# Patient Record
Sex: Male | Born: 1945 | Race: Black or African American | Hispanic: No | Marital: Married | Smoking: Never smoker
Health system: Southern US, Community
[De-identification: ages and names within clinical notes are randomized; demographics above are authoritative.]

## PROBLEM LIST (undated history)

## (undated) DIAGNOSIS — I10 Essential (primary) hypertension: Secondary | ICD-10-CM

## (undated) DIAGNOSIS — Z9889 Other specified postprocedural states: Secondary | ICD-10-CM

## (undated) DIAGNOSIS — Z973 Presence of spectacles and contact lenses: Secondary | ICD-10-CM

## (undated) HISTORY — DX: Other specified postprocedural states: Z98.890

## (undated) HISTORY — DX: Presence of spectacles and contact lenses: Z97.3

## (undated) HISTORY — DX: Essential (primary) hypertension: I10

---

## 1965-04-01 HISTORY — PX: INGUINAL HERNIA REPAIR: SUR1180

## 1991-04-02 HISTORY — PX: INGUINAL HERNIA REPAIR: SUR1180

## 2003-04-09 ENCOUNTER — Emergency Department (HOSPITAL_COMMUNITY): Admission: AD | Admit: 2003-04-09 | Discharge: 2003-04-09 | Payer: Self-pay | Admitting: Family Medicine

## 2014-08-18 LAB — PULMONARY FUNCTION TEST

## 2017-04-01 DIAGNOSIS — Z9289 Personal history of other medical treatment: Secondary | ICD-10-CM

## 2017-04-01 HISTORY — DX: Personal history of other medical treatment: Z92.89

## 2019-02-22 ENCOUNTER — Other Ambulatory Visit: Payer: Self-pay

## 2019-02-22 DIAGNOSIS — Z20822 Contact with and (suspected) exposure to covid-19: Secondary | ICD-10-CM

## 2019-02-23 LAB — NOVEL CORONAVIRUS, NAA: SARS-CoV-2, NAA: NOT DETECTED

## 2019-08-09 ENCOUNTER — Ambulatory Visit (INDEPENDENT_AMBULATORY_CARE_PROVIDER_SITE_OTHER): Payer: Federal, State, Local not specified - PPO | Admitting: Nurse Practitioner

## 2019-08-09 ENCOUNTER — Other Ambulatory Visit: Payer: Self-pay

## 2019-08-09 ENCOUNTER — Encounter: Payer: Self-pay | Admitting: Nurse Practitioner

## 2019-08-09 VITALS — BP 120/78 | HR 92 | Temp 97.3°F | Ht 71.5 in | Wt 287.0 lb

## 2019-08-09 DIAGNOSIS — K219 Gastro-esophageal reflux disease without esophagitis: Secondary | ICD-10-CM

## 2019-08-09 DIAGNOSIS — I10 Essential (primary) hypertension: Secondary | ICD-10-CM

## 2019-08-09 DIAGNOSIS — M5442 Lumbago with sciatica, left side: Secondary | ICD-10-CM

## 2019-08-09 DIAGNOSIS — E782 Mixed hyperlipidemia: Secondary | ICD-10-CM | POA: Diagnosis not present

## 2019-08-09 DIAGNOSIS — J302 Other seasonal allergic rhinitis: Secondary | ICD-10-CM

## 2019-08-09 DIAGNOSIS — G8929 Other chronic pain: Secondary | ICD-10-CM

## 2019-08-09 NOTE — Progress Notes (Signed)
Careteam: Patient Care Team: Sharon Seller, NP as PCP - General (Geriatric Medicine)  PLACE OF SERVICE:  [facility]  Advanced Directive information Does Patient Have a Medical Advance Directive?: No, Would patient like information on creating a medical advance directive?: No - Patient declined  No Active Allergies  Chief Complaint  Patient presents with  . Establish Care    New patient establish care, returning from [place]     HPI: Patient is a 74 y.o. male to establish care.  htn- controlled on amlodipine, doxazosin,  losartan  Hyperglycemia- trying to stay away from diabetes. Has not specifically changed diet but would like to. Using [place] daily   Seasonal allergies- using zyrtec 10 mg daily and flonase into nares which helps symptoms.  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   Hyperlipidemia- not requiring medication.   GERD/hiatal hernia- has a hard time swallowing certain foods. Has to chew foods well. Using omeprazole 20 mg daily   Low back pain- went to orthopedic ppl in the past, has had shots, exercise. He then was told he needed a hip replacement but never went back. Has to left leg sciatica.  Was told he has degenerative disc disease.  Just had labs in March 2021. Review of Systems:  Review of Systems  Constitutional: Negative for chills, fever and weight loss.  HENT: Positive for congestion. Negative for tinnitus.   Respiratory: Negative for cough, sputum production and shortness of breath.   Cardiovascular: Negative for chest pain, palpitations and leg swelling.  Gastrointestinal: Positive for heartburn (controlled by omeprazole. ). Negative for abdominal pain, constipation and diarrhea.  Genitourinary: Positive for frequency (at night, gets up 2 times on avg, has not changed recently). Negative for dysuria and urgency.  Musculoskeletal: Positive for back pain. Negative for falls,  joint pain and myalgias.  Skin: Negative.   Neurological: Negative for dizziness and headaches.  Endo/Heme/Allergies: Positive for environmental allergies.  Psychiatric/Behavioral: Negative for depression and memory loss. The patient does not have insomnia.     Past Medical History:  Diagnosis Date  . History of colonoscopy    [facility]  . History of MRI 04/01/2017  . Hypertension   . Wears glasses    Past Surgical History:  Procedure Laterality Date  . HERNIA REPAIR  04/01/1965   Dr Nelma Rothman. [place] per [facility] new patient packet   . HERNIA REPAIR  04/02/1991   [facility], Per [facility] New Patient Packet    Social History:   reports that he has never smoked. He has never used smokeless tobacco. He reports current alcohol use. He reports that he does not use drugs.  Family History  Problem Relation Age of Onset  . Other Sister   . Diabetes Brother   . Congestive Heart Failure Brother   . Stomach cancer Sister   . Obesity Brother     Medications: Patient's Medications  New Prescriptions   No medications on file  Previous Medications   AMLODIPINE (NORVASC) 10 MG TABLET    Take 10 mg by mouth daily.   CETIRIZINE (ZYRTEC) 10 MG TABLET    Take 10 mg by mouth daily as needed for allergies (Takes daily during allergy season).   DOXAZOSIN (CARDURA) 8 MG TABLET    Take 8 mg by mouth daily.   ELDERBERRY PO    Take 50 mg by mouth daily.    FLUTICASONE (FLONASE) 50  MCG/ACT NASAL SPRAY    Place 2 sprays into both nostrils as needed for allergies or rhinitis.   LOSARTAN (COZAAR) 100 MG TABLET    Take 100 mg by mouth daily.   MILK THISTLE 175 MG TABLET    Take 175 mg by mouth daily.   MULTIPLE VITAMINS-MINERALS (OCUVITE PO)    Take 1 capsule by mouth daily.    NUTRITIONAL SUPPLEMENTS (KIDNEY) CAPS    Take 2 capsules by mouth daily.   OMEGA-3 KRILL OIL 500 MG CAPS    Take 500 mg by mouth daily.   SITAGLIPTIN (JANUVIA) 25 MG TABLET    Take 25 mg by mouth  daily.  Modified Medications   No medications on file  Discontinued Medications   AMLODIPINE (NORVASC) 10 MG TABLET    Take 10 mg by mouth daily.   DOXAZOSIN (CARDURA) 2 MG TABLET    Take 2 mg by mouth daily.   OVER THE COUNTER MEDICATION    300 mg daily. Kidney Cleanser.    Physical Exam:  Vitals:   08/09/19 0911  BP: 120/78  Pulse: 92  Temp: (!) 97.3 F (36.3 C)  TempSrc: Temporal  SpO2: 98%  Weight: 287 lb (130.2 kg)  Height: 5' 11.5" (1.816 m)   Body mass index is 39.47 kg/m. Wt Readings from Last 3 Encounters:  08/09/19 287 lb (130.2 kg)    Physical Exam Constitutional:      General: He is not in acute distress.    Appearance: He is well-developed. He is not diaphoretic.  HENT:     Head: Normocephalic and atraumatic.     Mouth/Throat:     Pharynx: No oropharyngeal exudate.  Eyes:     Conjunctiva/sclera: Conjunctivae normal.     Pupils: Pupils are equal, round, and reactive to light.  Cardiovascular:     Rate and Rhythm: Normal rate and regular rhythm.     Heart sounds: Normal heart sounds.  Pulmonary:     Effort: Pulmonary effort is normal.     Breath sounds: Normal breath sounds.  Abdominal:     General: Bowel sounds are normal.     Palpations: Abdomen is soft.  Musculoskeletal:        General: No swelling or tenderness.     Cervical back: Normal range of motion and neck supple.     Lumbar back: Normal. No tenderness. Negative right straight leg raise test and negative left straight leg raise test.     Right lower leg: No edema.     Left lower leg: No edema.  Skin:    General: Skin is warm and dry.  Neurological:     Mental Status: He is alert and oriented to person, place, and time.    Labs reviewed: Basic Metabolic Panel: No results for input(s): NA, K, CL, CO2, GLUCOSE, BUN, CREATININE, CALCIUM, MG, PHOS, TSH in the last 8760 hours. Liver Function Tests: No results for input(s): AST, ALT, ALKPHOS, BILITOT, PROT, ALBUMIN in the last 8760  hours. No results for input(s): LIPASE, AMYLASE in the last 8760 hours. No results for input(s): AMMONIA in the last 8760 hours. CBC: No results for input(s): WBC, NEUTROABS, HGB, HCT, MCV, PLT in the last 8760 hours. Lipid Panel: No results for input(s): CHOL, HDL, LDLCALC, TRIG, CHOLHDL, LDLDIRECT in the last 8760 hours. TSH: No results for input(s): TSH in the last 8760 hours. A1C: No results found for: HGBA1C   Assessment/Plan 1. Chronic midline low back pain with left-sided sciatica -reports he has  had chronic pain and seen specialist in the past, since he has gained some weight pain has increased. He is also less active and feels like this has contributed.  - Ambulatory referral to Physical Therapy for further evaluation and treatment.   2. Gastroesophageal reflux disease without esophagitis -controlled on omprazole 20 mg daily with dietary modifications.  3. Essential hypertension -stable on amlodipine, doxazosin and losartan. Continue current regimen with lifestyle modifications.  4. Mixed hyperlipidemia -not currently on medication for this, reports he has had recent labs, awaiting records. Continue with dietary modifications.   5. Seasonal allergies Controlled on zyrtec and flonase.   5. Hyperglycemia States he has not been diagnosed with diabetes, but was placed on Januvia 25 mg daily for preventative, will get records to review.   Next appt: AWV first week in July (last was in June) Will await medical records for lab orders Routine follow up in 4 months  Faustine Tates K. Biagio Borg  [facility] [phone]

## 2019-08-09 NOTE — Patient Instructions (Addendum)
Schedule AWV first of July.

## 2019-08-13 ENCOUNTER — Ambulatory Visit: Payer: Self-pay | Admitting: Nurse Practitioner

## 2019-10-19 ENCOUNTER — Telehealth: Payer: Self-pay

## 2019-10-19 NOTE — Telephone Encounter (Signed)
No I have not reviewed his records.

## 2019-10-19 NOTE — Telephone Encounter (Signed)
I left detailed message on voicemail for Darren Fisher informing her that records were not received on her husband.

## 2019-10-19 NOTE — Telephone Encounter (Signed)
Patients wife stopped by the office to verify if Sharon Seller, NP received records from previous provider.  I reviewed basket where incoming records are held for the provider to review and did not locate any records on [employer]. We did receive records on his wife.  Mrs.Benedicto would like for Sharon Seller, NP to confirm that she has not received any records on her husband prior to her calling to ask why records have not been sent to [place].

## 2019-10-28 ENCOUNTER — Telehealth: Payer: Self-pay | Admitting: Nurse Practitioner

## 2019-10-28 NOTE — Telephone Encounter (Signed)
Pt would referral to Dr Christia Reading. ENT at [facility]. Once you generates that referral, I'll send it over.   Thanks,  Sigmund Hazel

## 2019-10-28 NOTE — Telephone Encounter (Signed)
Lm with pt to ck clarity for why ENT is needed & diagnosis for follow up. Wait for call back  Thanks, Misty Stanley

## 2019-10-28 NOTE — Telephone Encounter (Signed)
Spoke with pt & he shared that he has experience sinus & ear congestion most of his adult life that "comes & goes". Darren Fisher felt by seeing an ENT would help him.  I also made a 4 mth follow up appt for 12/29/19, which is what plan stated from visit in May.  Thanks, Misty Stanley

## 2019-10-28 NOTE — Telephone Encounter (Signed)
Need clarification for what he needs referral for. Also he has no follow up and did not schedule his AWV in July.

## 2019-10-29 ENCOUNTER — Encounter: Payer: Self-pay | Admitting: Nurse Practitioner

## 2019-10-29 LAB — HM DIABETES EYE EXAM

## 2019-11-02 ENCOUNTER — Other Ambulatory Visit: Payer: Self-pay

## 2019-11-02 DIAGNOSIS — Z20822 Contact with and (suspected) exposure to covid-19: Secondary | ICD-10-CM

## 2019-11-03 LAB — NOVEL CORONAVIRUS, NAA: SARS-CoV-2, NAA: NOT DETECTED

## 2019-11-03 LAB — SARS-COV-2, NAA 2 DAY TAT

## 2019-11-25 DIAGNOSIS — J343 Hypertrophy of nasal turbinates: Secondary | ICD-10-CM | POA: Insufficient documentation

## 2019-11-25 DIAGNOSIS — T7840XA Allergy, unspecified, initial encounter: Secondary | ICD-10-CM | POA: Insufficient documentation

## 2019-12-08 LAB — HM DIABETES EYE EXAM

## 2019-12-29 ENCOUNTER — Other Ambulatory Visit: Payer: Self-pay

## 2019-12-29 ENCOUNTER — Ambulatory Visit (INDEPENDENT_AMBULATORY_CARE_PROVIDER_SITE_OTHER): Payer: Federal, State, Local not specified - PPO | Admitting: Nurse Practitioner

## 2019-12-29 ENCOUNTER — Encounter: Payer: Self-pay | Admitting: Nurse Practitioner

## 2019-12-29 VITALS — BP 140/90 | HR 86 | Temp 97.7°F | Ht 71.5 in | Wt 289.4 lb

## 2019-12-29 DIAGNOSIS — Z1159 Encounter for screening for other viral diseases: Secondary | ICD-10-CM

## 2019-12-29 DIAGNOSIS — Z23 Encounter for immunization: Secondary | ICD-10-CM | POA: Diagnosis not present

## 2019-12-29 DIAGNOSIS — R0683 Snoring: Secondary | ICD-10-CM

## 2019-12-29 DIAGNOSIS — E1140 Type 2 diabetes mellitus with diabetic neuropathy, unspecified: Secondary | ICD-10-CM | POA: Insufficient documentation

## 2019-12-29 DIAGNOSIS — E782 Mixed hyperlipidemia: Secondary | ICD-10-CM | POA: Diagnosis not present

## 2019-12-29 DIAGNOSIS — J302 Other seasonal allergic rhinitis: Secondary | ICD-10-CM | POA: Insufficient documentation

## 2019-12-29 DIAGNOSIS — I10 Essential (primary) hypertension: Secondary | ICD-10-CM | POA: Diagnosis not present

## 2019-12-29 NOTE — Progress Notes (Signed)
Careteam: Patient Care Team: Sharon Seller, NP as PCP - General (Geriatric Medicine)  PLACE OF SERVICE:  [facility]  Advanced Directive information Does Patient Have a Medical Advance Directive?: Yes, Type of Advance Directive: Healthcare Power of Attorney, Does patient want to make changes to medical advance directive?: No - Patient declined  No Known Allergies  Chief Complaint  Patient presents with  . Medical Management of Chronic Issues    4 month follow up. Patient states that he is doing fine.Patient would like to get Flu vaccine today.  Marland Kitchen Best Practice Recommendations    Hep C screening, Pneumonia, Flu vaccine,Tetanus/Tdap vaccine.     HPI: Patient is a 74 y.o. male for routine follow up   Has not seen the allergies yet but has followed up with ENT. ENT felt like allergies would be the most beneficial. Reports ENT placed referral to allergist. Taking flonase for allergies at this time. Does not feel like this has been beneficial. Has good days and bad days, mostly bad. Worse since he has moved. Has not tired claritin or zyrtec. ENT recommended to use flonase.   DM-  No hypoglycemia, does not check blood sugar. Reports neuropathy to LE.  Has cataract, scheduled for surgery but insurance is not acting right  htn- does not follow dietary modifications, continues on norvasc, losartan, and cardura  Takes blood pressure at home but unsure of readings.   GERD- continues on omeprazole.   Review of Systems:  Review of Systems  Constitutional: Negative for chills, fever and weight loss.  HENT: Negative for tinnitus.   Respiratory: Negative for cough, sputum production and shortness of breath.   Cardiovascular: Negative for chest pain, palpitations and leg swelling.  Gastrointestinal: Negative for abdominal pain, constipation, diarrhea and heartburn.  Genitourinary: Negative for dysuria, frequency and urgency.  Musculoskeletal: Negative for back pain, falls, joint pain  and myalgias.  Skin: Negative.   Neurological: Negative for dizziness and headaches.  Endo/Heme/Allergies: Positive for environmental allergies.  Psychiatric/Behavioral: Negative for depression and memory loss. The patient does not have insomnia.    Past Medical History:  Diagnosis Date  . History of colonoscopy    [facility]  . History of MRI 04/01/2017  . Hypertension   . Wears glasses    Past Surgical History:  Procedure Laterality Date  . INGUINAL HERNIA REPAIR Right 04/01/1965   Dr Nelma Rothman. [place] per [facility] new patient packet   . INGUINAL HERNIA REPAIR Left 04/02/1991   [facility], Per [facility] New Patient Packet    Social History:   reports that he has never smoked. He has never used smokeless tobacco. He reports current alcohol use. He reports that he does not use drugs.  Family History  Problem Relation Age of Onset  . Asthma Father   . Other Sister   . Diabetes Brother   . Congestive Heart Failure Brother   . Stomach cancer Sister   . Obesity Brother     Medications: Patient's Medications  New Prescriptions   No medications on file  Previous Medications   AMLODIPINE (NORVASC) 10 MG TABLET    Take 10 mg by mouth daily.   DOXAZOSIN (CARDURA) 8 MG TABLET    Take 8 mg by mouth daily.   ELDERBERRY PO    Take 50 mg by mouth daily.    FLUTICASONE (FLONASE) 50 MCG/ACT NASAL SPRAY    Place 2 sprays into both nostrils as needed for allergies or rhinitis.  LOSARTAN (COZAAR) 100 MG TABLET    Take 100 mg by mouth daily.   MILK THISTLE 175 MG TABLET    Take 175 mg by mouth daily.   MULTIPLE VITAMINS-MINERALS (OCUVITE PO)    Take 1 capsule by mouth daily.    NUTRITIONAL SUPPLEMENTS (KIDNEY) CAPS    Take 2 capsules by mouth daily.   OMEGA-3 KRILL OIL 500 MG CAPS    Take 500 mg by mouth daily.   OMEPRAZOLE (PRILOSEC) 20 MG CAPSULE    Take 20 mg by mouth daily.   SITAGLIPTIN (JANUVIA) 25 MG TABLET    Take 25 mg by mouth daily.  Modified  Medications   No medications on file  Discontinued Medications   CETIRIZINE (ZYRTEC) 10 MG TABLET    Take 10 mg by mouth daily as needed for allergies (Takes daily during allergy season).   CETIRIZINE (ZYRTEC) 10 MG TABLET    Take by mouth.   FLUTICASONE (FLONASE) 50 MCG/ACT NASAL SPRAY    Place into the nose.   KETOROLAC (ACULAR) 0.5 % OPHTHALMIC SOLUTION    Place 1 drop into the left eye 4 (four) times daily.   OFLOXACIN (OCUFLOX) 0.3 % OPHTHALMIC SOLUTION    1 drop 4 (four) times daily.   PREDNISOLONE ACETATE (PRED FORTE) 1 % OPHTHALMIC SUSPENSION    Place 1 drop into the left eye 4 (four) times daily.    Physical Exam:  Vitals:   12/29/19 1327  BP: 140/90  Pulse: 86  Temp: 97.7 F (36.5 C)  TempSrc: Temporal  SpO2: 95%  Weight: 289 lb 6.4 oz (131.3 kg)  Height: 5' 11.5" (1.816 m)   Body mass index is 39.8 kg/m. Wt Readings from Last 3 Encounters:  12/29/19 289 lb 6.4 oz (131.3 kg)  08/09/19 287 lb (130.2 kg)    Physical Exam Constitutional:      General: He is not in acute distress.    Appearance: He is well-developed. He is not diaphoretic.  HENT:     Head: Normocephalic and atraumatic.     Mouth/Throat:     Pharynx: No oropharyngeal exudate.  Eyes:     Conjunctiva/sclera: Conjunctivae normal.     Pupils: Pupils are equal, round, and reactive to light.  Cardiovascular:     Rate and Rhythm: Normal rate and regular rhythm.     Heart sounds: Normal heart sounds.  Pulmonary:     Effort: Pulmonary effort is normal.     Breath sounds: Normal breath sounds.  Abdominal:     General: Bowel sounds are normal.     Palpations: Abdomen is soft.  Musculoskeletal:        General: No tenderness.     Cervical back: Normal range of motion and neck supple.  Skin:    General: Skin is warm and dry.  Neurological:     Mental Status: He is alert and oriented to person, place, and time.     Labs reviewed: Basic Metabolic Panel: No results for input(s): NA, K, CL, CO2,  GLUCOSE, BUN, CREATININE, CALCIUM, MG, PHOS, TSH in the last 8760 hours. Liver Function Tests: No results for input(s): AST, ALT, ALKPHOS, BILITOT, PROT, ALBUMIN in the last 8760 hours. No results for input(s): LIPASE, AMYLASE in the last 8760 hours. No results for input(s): AMMONIA in the last 8760 hours. CBC: No results for input(s): WBC, NEUTROABS, HGB, HCT, MCV, PLT in the last 8760 hours. Lipid Panel: No results for input(s): CHOL, HDL, LDLCALC, TRIG, CHOLHDL, LDLDIRECT in the last  8760 hours. TSH: No results for input(s): TSH in the last 8760 hours. A1C: No results found for: HGBA1C   Assessment/Plan 1. Need for influenza vaccination - Flu Vaccine QUAD High Dose(Fluad)  2. Essential hypertension Goal is <140/90, repeat blood pressure was unchanged. He is currently on amlodipine 10 mg daily, doxazosin 8 mg daily with losartan 100 mg daily.  Discussed dietary modifications to bring him to goal. Handouts given on salty six and DASH diet, encouraged dietary modifications. To monitor bp at home and notify if persistently staying over 140/90. Will continue current medication.  - COMPLETE METABOLIC PANEL WITH GFR - CBC with Differential/Platelet  3. Mixed hyperlipidemia -not currently on medication, LDL goal <70, will get labs today to evaluate  - Lipid Panel - COMPLETE METABOLIC PANEL WITH GFR  4. Seasonal allergies Worsening symptoms, has been referred to allergies, using Flonase, encouraged to add claritin or zyrtec 10 mg daily and to continue flonase at this time.   5. Type 2 diabetes mellitus with diabetic neuropathy, without long-term current use of insulin (HCC) -Encouraged dietary compliance, routine foot care/monitoring and to keep up with diabetic eye exams through ophthalmology  -continues on januvia - Hemoglobin A1c  6. Morbid obesity (HCC) -weight loss needed, he admits to needing help with the weight loss and interested in medical weight loss management referral.    - Amb Ref to Medical Weight Management - Ambulatory referral to Pulmonology  7. Snores - Ambulatory referral to Pulmonology for evaluation of OSA  8. Need for hepatitis C screening test - Hepatitis C antibody   Next appt: 6 months, sooner If needed Belynda Pagaduan K. Biagio Borg  [facility] Adult Medicine [phone]

## 2019-12-29 NOTE — Patient Instructions (Signed)
DASH Eating Plan DASH stands for "Dietary Approaches to Stop Hypertension." The DASH eating plan is a healthy eating plan that has been shown to reduce high blood pressure (hypertension). It may also reduce your risk for type 2 diabetes, heart disease, and stroke. The DASH eating plan may also help with weight loss. What are tips for following this plan?  General guidelines  Avoid eating more than 2,300 mg (milligrams) of salt (sodium) a day. If you have hypertension, you may need to reduce your sodium intake to 1,500 mg a day.  Limit alcohol intake to no more than 1 drink a day for nonpregnant women and 2 drinks a day for men. One drink equals 12 oz of beer, 5 oz of wine, or 1 oz of hard liquor.  Work with your health care provider to maintain a healthy body weight or to lose weight. Ask what an ideal weight is for you.  Get at least 30 minutes of exercise that causes your heart to beat faster (aerobic exercise) most days of the week. Activities may include walking, swimming, or biking.  Work with your health care provider or diet and nutrition specialist (dietitian) to adjust your eating plan to your individual calorie needs. Reading food labels   Check food labels for the amount of sodium per serving. Choose foods with less than 5 percent of the Daily Value of sodium. Generally, foods with less than 300 mg of sodium per serving fit into this eating plan.  To find whole grains, look for the word "whole" as the first word in the ingredient list. Shopping  Buy products labeled as "low-sodium" or "no salt added."  Buy fresh foods. Avoid canned foods and premade or frozen meals. Cooking  Avoid adding salt when cooking. Use salt-free seasonings or herbs instead of table salt or sea salt. Check with your health care provider or pharmacist before using salt substitutes.  Do not fry foods. Cook foods using healthy methods such as baking, boiling, grilling, and broiling instead.  Cook with  heart-healthy oils, such as olive, canola, soybean, or sunflower oil. Meal planning  Eat a balanced diet that includes: ? 5 or more servings of fruits and vegetables each day. At each meal, try to fill half of your plate with fruits and vegetables. ? Up to 6-8 servings of whole grains each day. ? Less than 6 oz of lean meat, poultry, or fish each day. A 3-oz serving of meat is about the same size as a deck of cards. One egg equals 1 oz. ? 2 servings of low-fat dairy each day. ? A serving of nuts, seeds, or beans 5 times each week. ? Heart-healthy fats. Healthy fats called Omega-3 fatty acids are found in foods such as flaxseeds and coldwater fish, like sardines, salmon, and mackerel.  Limit how much you eat of the following: ? Canned or prepackaged foods. ? Food that is high in trans fat, such as fried foods. ? Food that is high in saturated fat, such as fatty meat. ? Sweets, desserts, sugary drinks, and other foods with added sugar. ? Full-fat dairy products.  Do not salt foods before eating.  Try to eat at least 2 vegetarian meals each week.  Eat more home-cooked food and less restaurant, buffet, and fast food.  When eating at a restaurant, ask that your food be prepared with less salt or no salt, if possible. What foods are recommended? The items listed may not be a complete list. Talk with your dietitian about   what dietary choices are best for you. Grains Whole-grain or whole-wheat bread. Whole-grain or whole-wheat pasta. Brown rice. Oatmeal. Quinoa. Bulgur. Whole-grain and low-sodium cereals. Pita bread. Low-fat, low-sodium crackers. Whole-wheat flour tortillas. Vegetables Fresh or frozen vegetables (raw, steamed, roasted, or grilled). Low-sodium or reduced-sodium tomato and vegetable juice. Low-sodium or reduced-sodium tomato sauce and tomato paste. Low-sodium or reduced-sodium canned vegetables. Fruits All fresh, dried, or frozen fruit. Canned fruit in natural juice (without  added sugar). Meat and other protein foods Skinless chicken or turkey. Ground chicken or turkey. Pork with fat trimmed off. Fish and seafood. Egg whites. Dried beans, peas, or lentils. Unsalted nuts, nut butters, and seeds. Unsalted canned beans. Lean cuts of beef with fat trimmed off. Low-sodium, lean deli meat. Dairy Low-fat (1%) or fat-free (skim) milk. Fat-free, low-fat, or reduced-fat cheeses. Nonfat, low-sodium ricotta or cottage cheese. Low-fat or nonfat yogurt. Low-fat, low-sodium cheese. Fats and oils Soft margarine without trans fats. Vegetable oil. Low-fat, reduced-fat, or light mayonnaise and salad dressings (reduced-sodium). Canola, safflower, olive, soybean, and sunflower oils. Avocado. Seasoning and other foods Herbs. Spices. Seasoning mixes without salt. Unsalted popcorn and pretzels. Fat-free sweets. What foods are not recommended? The items listed may not be a complete list. Talk with your dietitian about what dietary choices are best for you. Grains Baked goods made with fat, such as croissants, muffins, or some breads. Dry pasta or rice meal packs. Vegetables Creamed or fried vegetables. Vegetables in a cheese sauce. Regular canned vegetables (not low-sodium or reduced-sodium). Regular canned tomato sauce and paste (not low-sodium or reduced-sodium). Regular tomato and vegetable juice (not low-sodium or reduced-sodium). Pickles. Olives. Fruits Canned fruit in a light or heavy syrup. Fried fruit. Fruit in cream or butter sauce. Meat and other protein foods Fatty cuts of meat. Ribs. Fried meat. Bacon. Sausage. Bologna and other processed lunch meats. Salami. Fatback. Hotdogs. Bratwurst. Salted nuts and seeds. Canned beans with added salt. Canned or smoked fish. Whole eggs or egg yolks. Chicken or turkey with skin. Dairy Whole or 2% milk, cream, and half-and-half. Whole or full-fat cream cheese. Whole-fat or sweetened yogurt. Full-fat cheese. Nondairy creamers. Whipped toppings.  Processed cheese and cheese spreads. Fats and oils Butter. Stick margarine. Lard. Shortening. Ghee. Bacon fat. Tropical oils, such as coconut, palm kernel, or palm oil. Seasoning and other foods Salted popcorn and pretzels. Onion salt, garlic salt, seasoned salt, table salt, and sea salt. Worcestershire sauce. Tartar sauce. Barbecue sauce. Teriyaki sauce. Soy sauce, including reduced-sodium. Steak sauce. Canned and packaged gravies. Fish sauce. Oyster sauce. Cocktail sauce. Horseradish that you find on the shelf. Ketchup. Mustard. Meat flavorings and tenderizers. Bouillon cubes. Hot sauce and Tabasco sauce. Premade or packaged marinades. Premade or packaged taco seasonings. Relishes. Regular salad dressings. Where to find more information:  National Heart, Lung, and Blood Institute: www.nhlbi.nih.gov  American Heart Association: www.heart.org Summary  The DASH eating plan is a healthy eating plan that has been shown to reduce high blood pressure (hypertension). It may also reduce your risk for type 2 diabetes, heart disease, and stroke.  With the DASH eating plan, you should limit salt (sodium) intake to 2,300 mg a day. If you have hypertension, you may need to reduce your sodium intake to 1,500 mg a day.  When on the DASH eating plan, aim to eat more fresh fruits and vegetables, whole grains, lean proteins, low-fat dairy, and heart-healthy fats.  Work with your health care provider or diet and nutrition specialist (dietitian) to adjust your eating plan to your   individual calorie needs. This information is not intended to replace advice given to you by your health care provider. Make sure you discuss any questions you have with your health care provider. Document Revised: 02/28/2017 Document Reviewed: 03/11/2016 Elsevier Patient Education  2020 Elsevier Inc.  

## 2019-12-30 ENCOUNTER — Telehealth: Payer: Self-pay

## 2019-12-30 DIAGNOSIS — E782 Mixed hyperlipidemia: Secondary | ICD-10-CM

## 2019-12-30 MED ORDER — ROSUVASTATIN CALCIUM 5 MG PO TABS: ORAL_TABLET | ORAL | 1 refills | Status: DC

## 2019-12-30 MED ORDER — ROSUVASTATIN CALCIUM 10 MG PO TABS: ORAL_TABLET | ORAL | 1 refills | Status: DC

## 2019-12-30 NOTE — Telephone Encounter (Signed)
-----   Message from Sharon Seller, NP sent at 12/30/2019  1:32 PM EDT ----- LDL (bad cholesterol) is high, should be less than 70 but at 142, would recommend starting crestor 10 mg daily at this time with dietary modifications to bring to goal.  To start crestor at 5 mg  (1/2 tablet) daily for 2 weeks then increase to 1 tablet daily there after- to take in the evening/bedtime for best results. Follow up CMP and lipid panel (fasting) in 6 weeks   A1c at 7.0, ideally would like to be less than 7, working on diet modifications and increase in physical activity can help bring him to goal.  Slightly anemic (low hgb on lab), can we add TIBC, iron and ferritin level.  -impaired kidney function noted. Encourage proper hydration and to avoid NSAIDS (Aleve, Advil, Motrin, Ibuprofen)

## 2019-12-30 NOTE — Telephone Encounter (Signed)
Patients wife was in office to get labs done. I discussed labs with Mrs.Homesly and she verbalized understanding.  1. RX for crestor sent to pharmacy 2.) Add on request given to Coralie Keens lab tech 3.) Appt scheduled for 02/16/2020 to recheck CMP and Lipid 4.) Future orders placed

## 2020-01-01 LAB — CBC WITH DIFFERENTIAL/PLATELET
Absolute Monocytes: 575 cells/uL (ref 200–950)
Basophils Absolute: 21 cells/uL (ref 0–200)
Basophils Relative: 0.3 %
Eosinophils Absolute: 263 cells/uL (ref 15–500)
Eosinophils Relative: 3.7 %
HCT: 40.7 % (ref 38.5–50.0)
Hemoglobin: 13 g/dL — ABNORMAL LOW (ref 13.2–17.1)
Lymphs Abs: 2215 cells/uL (ref 850–3900)
MCH: 25.8 pg — ABNORMAL LOW (ref 27.0–33.0)
MCHC: 31.9 g/dL — ABNORMAL LOW (ref 32.0–36.0)
MCV: 80.8 fL (ref 80.0–100.0)
MPV: 9.6 fL (ref 7.5–12.5)
Monocytes Relative: 8.1 %
Neutro Abs: 4026 cells/uL (ref 1500–7800)
Neutrophils Relative %: 56.7 %
Platelets: 290 10*3/uL (ref 140–400)
RBC: 5.04 10*6/uL (ref 4.20–5.80)
RDW: 14.5 % (ref 11.0–15.0)
Total Lymphocyte: 31.2 %
WBC: 7.1 10*3/uL (ref 3.8–10.8)

## 2020-01-01 LAB — IRON,TIBC AND FERRITIN PANEL
%SAT: 25 % (calc) (ref 20–48)
Ferritin: 115 ng/mL (ref 24–380)
Iron: 95 ug/dL (ref 50–180)
TIBC: 383 mcg/dL (calc) (ref 250–425)

## 2020-01-01 LAB — TEST AUTHORIZATION

## 2020-01-01 LAB — COMPLETE METABOLIC PANEL WITH GFR
AG Ratio: 1.7 (calc) (ref 1.0–2.5)
ALT: 14 U/L (ref 9–46)
AST: 16 U/L (ref 10–35)
Albumin: 4.8 g/dL (ref 3.6–5.1)
Alkaline phosphatase (APISO): 85 U/L (ref 35–144)
BUN/Creatinine Ratio: 12 (calc) (ref 6–22)
BUN: 16 mg/dL (ref 7–25)
CO2: 25 mmol/L (ref 20–32)
Calcium: 9.7 mg/dL (ref 8.6–10.3)
Chloride: 102 mmol/L (ref 98–110)
Creat: 1.37 mg/dL — ABNORMAL HIGH (ref 0.70–1.18)
GFR, Est African American: 58 mL/min/{1.73_m2} — ABNORMAL LOW (ref >=60)
GFR, Est Non African American: 50 mL/min/{1.73_m2} — ABNORMAL LOW (ref >=60)
Globulin: 2.9 g/dL (calc) (ref 1.9–3.7)
Glucose, Bld: 108 mg/dL — ABNORMAL HIGH (ref 65–99)
Potassium: 4.5 mmol/L (ref 3.5–5.3)
Sodium: 137 mmol/L (ref 135–146)
Total Bilirubin: 0.5 mg/dL (ref 0.2–1.2)
Total Protein: 7.7 g/dL (ref 6.1–8.1)

## 2020-01-01 LAB — HEMOGLOBIN A1C
Hgb A1c MFr Bld: 7 % of total Hgb — ABNORMAL HIGH (ref <5.7)
Mean Plasma Glucose: 154 (calc)
eAG (mmol/L): 8.5 (calc)

## 2020-01-01 LAB — LIPID PANEL
Cholesterol: 205 mg/dL — ABNORMAL HIGH (ref <200)
HDL: 35 mg/dL — ABNORMAL LOW (ref >=40)
LDL Cholesterol (Calc): 142 mg/dL (calc) — ABNORMAL HIGH
Non-HDL Cholesterol (Calc): 170 mg/dL (calc) — ABNORMAL HIGH (ref <130)
Total CHOL/HDL Ratio: 5.9 (calc) — ABNORMAL HIGH (ref <5.0)
Triglycerides: 150 mg/dL — ABNORMAL HIGH (ref <150)

## 2020-01-01 LAB — HEPATITIS C ANTIBODY
Hepatitis C Ab: NONREACTIVE
SIGNAL TO CUT-OFF: 0.02 (ref <1.00)

## 2020-02-16 ENCOUNTER — Other Ambulatory Visit: Payer: Federal, State, Local not specified - PPO

## 2020-02-16 ENCOUNTER — Other Ambulatory Visit: Payer: Self-pay

## 2020-02-16 DIAGNOSIS — E782 Mixed hyperlipidemia: Secondary | ICD-10-CM

## 2020-02-17 LAB — COMPLETE METABOLIC PANEL WITH GFR
AG Ratio: 1.5 (calc) (ref 1.0–2.5)
ALT: 17 U/L (ref 9–46)
AST: 16 U/L (ref 10–35)
Albumin: 4.4 g/dL (ref 3.6–5.1)
Alkaline phosphatase (APISO): 80 U/L (ref 35–144)
BUN/Creatinine Ratio: 12 (calc) (ref 6–22)
BUN: 15 mg/dL (ref 7–25)
CO2: 30 mmol/L (ref 20–32)
Calcium: 9.3 mg/dL (ref 8.6–10.3)
Chloride: 103 mmol/L (ref 98–110)
Creat: 1.3 mg/dL — ABNORMAL HIGH (ref 0.70–1.18)
GFR, Est African American: 62 mL/min/{1.73_m2} (ref >=60)
GFR, Est Non African American: 53 mL/min/{1.73_m2} — ABNORMAL LOW (ref >=60)
Globulin: 3 g/dL (calc) (ref 1.9–3.7)
Glucose, Bld: 144 mg/dL — ABNORMAL HIGH (ref 65–99)
Potassium: 4.6 mmol/L (ref 3.5–5.3)
Sodium: 140 mmol/L (ref 135–146)
Total Bilirubin: 0.3 mg/dL (ref 0.2–1.2)
Total Protein: 7.4 g/dL (ref 6.1–8.1)

## 2020-02-17 LAB — LIPID PANEL
Cholesterol: 129 mg/dL (ref <200)
HDL: 34 mg/dL — ABNORMAL LOW (ref >=40)
LDL Cholesterol (Calc): 75 mg/dL (calc)
Non-HDL Cholesterol (Calc): 95 mg/dL (calc) (ref <130)
Total CHOL/HDL Ratio: 3.8 (calc) (ref <5.0)
Triglycerides: 123 mg/dL (ref <150)

## 2020-05-02 ENCOUNTER — Other Ambulatory Visit: Payer: Self-pay | Admitting: *Deleted

## 2020-05-02 MED ORDER — AMLODIPINE BESYLATE 10 MG PO TABS
10.0000 mg | ORAL_TABLET | Freq: Every day | ORAL | 1 refills | Status: DC
Start: 2020-05-02 — End: 2021-02-05

## 2020-05-02 MED ORDER — SITAGLIPTIN PHOSPHATE 25 MG PO TABS: 25.0000 mg | ORAL_TABLET | Freq: Every day | ORAL | 1 refills | Status: DC

## 2020-05-02 MED ORDER — LOSARTAN POTASSIUM 100 MG PO TABS
100.0000 mg | ORAL_TABLET | Freq: Every day | ORAL | 1 refills | Status: DC
Start: 2020-05-02 — End: 2020-10-25

## 2020-05-02 NOTE — Telephone Encounter (Signed)
Patient wife requested refills Patient has upcoming appointment.

## 2020-06-28 ENCOUNTER — Other Ambulatory Visit: Payer: Self-pay

## 2020-06-28 ENCOUNTER — Other Ambulatory Visit: Payer: Self-pay | Admitting: Nurse Practitioner

## 2020-06-28 ENCOUNTER — Ambulatory Visit (INDEPENDENT_AMBULATORY_CARE_PROVIDER_SITE_OTHER): Payer: Federal, State, Local not specified - PPO | Admitting: Nurse Practitioner

## 2020-06-28 ENCOUNTER — Encounter: Payer: Self-pay | Admitting: Nurse Practitioner

## 2020-06-28 VITALS — BP 122/78 | HR 104 | Temp 96.8°F | Ht 72.0 in | Wt 300.0 lb

## 2020-06-28 DIAGNOSIS — E782 Mixed hyperlipidemia: Secondary | ICD-10-CM

## 2020-06-28 DIAGNOSIS — M5442 Lumbago with sciatica, left side: Secondary | ICD-10-CM

## 2020-06-28 DIAGNOSIS — K219 Gastro-esophageal reflux disease without esophagitis: Secondary | ICD-10-CM

## 2020-06-28 DIAGNOSIS — E1140 Type 2 diabetes mellitus with diabetic neuropathy, unspecified: Secondary | ICD-10-CM

## 2020-06-28 DIAGNOSIS — N401 Enlarged prostate with lower urinary tract symptoms: Secondary | ICD-10-CM

## 2020-06-28 DIAGNOSIS — J302 Other seasonal allergic rhinitis: Secondary | ICD-10-CM

## 2020-06-28 DIAGNOSIS — I10 Essential (primary) hypertension: Secondary | ICD-10-CM

## 2020-06-28 DIAGNOSIS — G8929 Other chronic pain: Secondary | ICD-10-CM

## 2020-06-28 DIAGNOSIS — R351 Nocturia: Secondary | ICD-10-CM

## 2020-06-28 DIAGNOSIS — R350 Frequency of micturition: Secondary | ICD-10-CM

## 2020-06-28 MED ORDER — ROSUVASTATIN CALCIUM 10 MG PO TABS: 10.0000 mg | ORAL_TABLET | Freq: Every day | ORAL | 1 refills | Status: DC

## 2020-06-28 NOTE — Patient Instructions (Addendum)
Tylenol 500 mg 2 tablets every 8 hours PRN pain.   Weight loss will help your pains.   To use nasal wash daily Can use saline as needed throughout the day if needed

## 2020-06-28 NOTE — Progress Notes (Signed)
Careteam: Darren Fisher Care Team: Sharon Seller, NP as PCP - General (Geriatric Medicine)  PLACE OF SERVICE:  [facility]  Advanced Directive information    No Known Allergies  Chief Complaint  Darren Fisher presents with  . Medical Management of Chronic Issues    6 month follow-up and discuss need for TD/tdap, PNA, and A1c. Foot exam today. Examine big toe on right foot. Sinus congestions, planning to see Dr.bates. Discuss urinary frequency at night. Ongoing back and hip pain.       HPI: Darren Fisher is a 75 y.o. male here for follow up.  Allergies- Went to see Dr. Jenne Pane who suggested an allergist. Awaiting to hear back from insurance before booking an appointment. Takes OTC medication to help with symptom relief.   Joint pain- was told by a specialist in the past to have DDD in lower back, feels a constant pain when trying to get up and get moving. Does not take any medication to help. Sometimes has sciatica pain down left leg but it resolves quickly. Has gained 11 pounds since last visit. Feels like he uses his back as an excuse not to exercise. Wants to start eating better and working out.   Urinary frequency- reports increased frequency at night, going to the bathroom 2-3 times. Denies dysuria or urgency.  DM- no episodes of hypoglycemia, but does not monitor blood sugars at home.   HTN- continues on norvasc, losartan. Reports he is not taking his doxazosin. Checks BP at home and reports they are normal at home but cannot remember exact numbers.   GERD- no episodes of heartburn, continues on omeprazole  Awaiting cataract surgery once insurance is corrected   Review of Systems:  Review of Systems  Constitutional: Negative for chills, fever and weight loss.  HENT: Negative for tinnitus.   Respiratory: Negative for cough, sputum production and shortness of breath.   Cardiovascular: Negative for chest pain, palpitations and leg swelling.  Gastrointestinal: Negative for abdominal  pain, constipation, nausea and vomiting.  Genitourinary: Positive for frequency. Negative for dysuria and urgency.  Musculoskeletal: Positive for back pain and joint pain. Negative for falls and myalgias.  Skin: Negative.   Neurological: Negative for dizziness, weakness and headaches.  Psychiatric/Behavioral: Negative for depression and memory loss. The Darren Fisher does not have insomnia.     Past Medical History:  Diagnosis Date  . History of colonoscopy    [facility]  . History of MRI 04/01/2017  . Hypertension   . Wears glasses    Past Surgical History:  Procedure Laterality Date  . INGUINAL HERNIA REPAIR Right 04/01/1965   Dr Nelma Rothman. [facility] per [facility] new Darren Fisher packet   . INGUINAL HERNIA REPAIR Left 04/02/1991   [facility], Per [facility] New Darren Fisher Packet    Social History:   reports that he has never smoked. He has never used smokeless tobacco. He reports current alcohol use. He reports that he does not use drugs.  Family History  Problem Relation Age of Onset  . Asthma Father   . Other Sister   . Diabetes Brother   . Congestive Heart Failure Brother   . Stomach cancer Sister   . Obesity Brother     Medications: Darren Fisher's Medications  New Prescriptions   No medications on file  Previous Medications   AMLODIPINE (NORVASC) 10 MG TABLET    Take 1 tablet (10 mg total) by mouth daily.   DOXAZOSIN (CARDURA) 8 MG TABLET    Take 8 mg  by mouth daily.   ELDERBERRY PO    Take 50 mg by mouth daily.    LOSARTAN (COZAAR) 100 MG TABLET    Take 1 tablet (100 mg total) by mouth daily.   MILK THISTLE 175 MG TABLET    Take 175 mg by mouth daily.   MULTIPLE VITAMINS-MINERALS (OCUVITE PO)    Take 1 capsule by mouth daily.    NUTRITIONAL SUPPLEMENTS (KIDNEY) CAPS    Take 2 capsules by mouth daily.   OMEGA-3 KRILL OIL 500 MG CAPS    Take 500 mg by mouth daily.   OMEPRAZOLE (PRILOSEC) 20 MG CAPSULE    Take 20 mg by mouth daily.   ROSUVASTATIN (CRESTOR) 10 MG  TABLET    TAKE 1/2 TABLET BY MOUTH EVERY DAY FOR 2 WEEKS. THEN INCREASE TO 1 TABLET BY MOUTH EVERY DAY   SITAGLIPTIN (JANUVIA) 25 MG TABLET    Take 1 tablet (25 mg total) by mouth daily.  Modified Medications   No medications on file  Discontinued Medications   FLUTICASONE (FLONASE) 50 MCG/ACT NASAL SPRAY    Place 2 sprays into both nostrils as needed for allergies or rhinitis.    Physical Exam:  Vitals:   06/28/20 1312  Weight: 300 lb (136.1 kg)  Height: 6' (1.829 m)   Body mass index is 40.69 kg/m. Wt Readings from Last 3 Encounters:  06/28/20 300 lb (136.1 kg)  12/29/19 289 lb 6.4 oz (131.3 kg)  08/09/19 287 lb (130.2 kg)    Physical Exam Constitutional:      General: He is not in acute distress.    Appearance: He is well-developed. He is not diaphoretic.  HENT:     Head: Normocephalic and atraumatic.     Nose: Nose normal.  Eyes:     Pupils: Pupils are equal, round, and reactive to light.  Cardiovascular:     Rate and Rhythm: Normal rate and regular rhythm.     Pulses:          Posterior tibial pulses are 2+ on the right side and 2+ on the left side.     Heart sounds: Normal heart sounds.  Pulmonary:     Effort: Pulmonary effort is normal.     Breath sounds: Normal breath sounds.  Abdominal:     General: Bowel sounds are normal.     Palpations: Abdomen is soft.  Musculoskeletal:        General: No tenderness.     Cervical back: Normal range of motion and neck supple.     Lumbar back: Normal.  Feet:     Right foot:     Protective Sensation: 10 sites tested. 10 sites sensed.     Skin integrity: Skin integrity normal.     Toenail Condition: Fungal disease present.    Left foot:     Protective Sensation: 10 sites tested. 10 sites sensed.     Skin integrity: Skin integrity normal.     Toenail Condition: Fungal disease present. Skin:    General: Skin is warm and dry.  Neurological:     Mental Status: He is alert and oriented to person, place, and time.   Psychiatric:        Mood and Affect: Mood normal.        Behavior: Behavior normal.    Labs reviewed: Basic Metabolic Panel: Recent Labs    12/29/19 1403 02/16/20 1012  NA 137 140  K 4.5 4.6  CL 102 103  CO2 25 30  GLUCOSE 108* 144*  BUN 16 15  CREATININE 1.37* 1.30*  CALCIUM 9.7 9.3   Liver Function Tests: Recent Labs    12/29/19 1403 02/16/20 1012  AST 16 16  ALT 14 17  BILITOT 0.5 0.3  PROT 7.7 7.4   No results for input(s): LIPASE, AMYLASE in the last 8760 hours. No results for input(s): AMMONIA in the last 8760 hours. CBC: Recent Labs    12/29/19 1403  WBC 7.1  NEUTROABS 4,026  HGB 13.0*  HCT 40.7  MCV 80.8  PLT 290   Lipid Panel: Recent Labs    12/29/19 1403 02/16/20 1012  CHOL 205* 129  HDL 35* 34*  LDLCALC 142* 75  TRIG 150* 123  CHOLHDL 5.9* 3.8   TSH: No results for input(s): TSH in the last 8760 hours. A1C: Lab Results  Component Value Date   HGBA1C 7.0 (H) 12/29/2019     Assessment/Plan 1. Essential hypertension BP today 122/78, continue taking amlodipine and losartan.  Discussed dietary modifications and daily exercise. Encouraged him to record his blood pressure measurements at home and bring in next visit.  - CBC with Differential/Platelet - COMPLETE METABOLIC PANEL WITH GFR  2. Mixed hyperlipidemia Last LDL was 75, continue taking Crestor 10 mg daily. Encouraged to follow a healthy diet and increase his daily exercise. - COMPLETE METABOLIC PANEL WITH GFR - rosuvastatin (CRESTOR) 10 MG tablet; Take 1 tablet (10 mg total) by mouth daily.  Dispense: 90 tablet; Refill: 1  3. Seasonal allergies Manageable, continue use of OTC allergy relief. Encouraged use of neti pot daily to help flush sinuses.   4. Type 2 diabetes mellitus with diabetic neuropathy, without long-term current use of insulin (HCC) Discussed picking food and drink choices that are low in sugar. Does not check sugars at home, will check A1C today. Foot exam was  normal. Encouraged to continue routine foot care/monitoring, dietary compliance, and keeping up with eye exams. - Hemoglobin A1c  5. Morbid obesity (HCC) Educated on increasing physical exercise 30 min five days a week such as walking around the neighborhood with his wife. Discussed importance of eating a healthy diet, reducing sugar and carbohydrate intake.   6. Gastroesophageal reflux disease without esophagitis Stable on omeprazole 20 mg daily with dietary modifications  7. Chronic midline low back pain with left-sided sciatica Saw a specialist in the past for chronic back pain. Has gained weight recently and is now less active. Encouraged to use of tylenol for pain and to begin exercising/eating better to help with weight loss. Recommend physical therapy and strength training exercises to help build muscles to support back. -Ambulatory referral to Physical Therapy  8. Urinary frequency Has increased frequently in the last few months. Denies dysuria or urgency. He stopped taking doxazosin 8 mg daily after moving (unsure why) likely why frequency has increased. -check PSA and urinalysis.  - PSA - POC Urinalysis Dipstick-neg leukocytes   Next appt: 6 months  Rakayla Ricklefs K. Biagio Borg  I personally was present during the history, physical exam and medical decision-making activities of this service and have verified that the service and findings are accurately documented in the student's note  Delora Gravatt K. Biagio Borg  [facility] [phone]

## 2020-06-29 DIAGNOSIS — N400 Enlarged prostate without lower urinary tract symptoms: Secondary | ICD-10-CM | POA: Insufficient documentation

## 2020-06-29 LAB — CBC WITH DIFFERENTIAL/PLATELET
Absolute Monocytes: 592 cells/uL (ref 200–950)
Basophils Absolute: 22 cells/uL (ref 0–200)
Basophils Relative: 0.3 %
Eosinophils Absolute: 170 cells/uL (ref 15–500)
Eosinophils Relative: 2.3 %
HCT: 40 % (ref 38.5–50.0)
Hemoglobin: 12.7 g/dL — ABNORMAL LOW (ref 13.2–17.1)
Lymphs Abs: 2242 cells/uL (ref 850–3900)
MCH: 25.5 pg — ABNORMAL LOW (ref 27.0–33.0)
MCHC: 31.8 g/dL — ABNORMAL LOW (ref 32.0–36.0)
MCV: 80.3 fL (ref 80.0–100.0)
MPV: 9.7 fL (ref 7.5–12.5)
Monocytes Relative: 8 %
Neutro Abs: 4373 cells/uL (ref 1500–7800)
Neutrophils Relative %: 59.1 %
Platelets: 282 10*3/uL (ref 140–400)
RBC: 4.98 10*6/uL (ref 4.20–5.80)
RDW: 14.3 % (ref 11.0–15.0)
Total Lymphocyte: 30.3 %
WBC: 7.4 10*3/uL (ref 3.8–10.8)

## 2020-06-29 LAB — COMPLETE METABOLIC PANEL WITH GFR
AG Ratio: 1.7 (calc) (ref 1.0–2.5)
ALT: 17 U/L (ref 9–46)
AST: 15 U/L (ref 10–35)
Albumin: 4.7 g/dL (ref 3.6–5.1)
Alkaline phosphatase (APISO): 92 U/L (ref 35–144)
BUN/Creatinine Ratio: 11 (calc) (ref 6–22)
BUN: 16 mg/dL (ref 7–25)
CO2: 29 mmol/L (ref 20–32)
Calcium: 9.4 mg/dL (ref 8.6–10.3)
Chloride: 103 mmol/L (ref 98–110)
Creat: 1.45 mg/dL — ABNORMAL HIGH (ref 0.70–1.18)
GFR, Est African American: 54 mL/min/{1.73_m2} — ABNORMAL LOW (ref >=60)
GFR, Est Non African American: 47 mL/min/{1.73_m2} — ABNORMAL LOW (ref >=60)
Globulin: 2.8 g/dL (calc) (ref 1.9–3.7)
Glucose, Bld: 142 mg/dL — ABNORMAL HIGH (ref 65–139)
Potassium: 4.7 mmol/L (ref 3.5–5.3)
Sodium: 139 mmol/L (ref 135–146)
Total Bilirubin: 0.4 mg/dL (ref 0.2–1.2)
Total Protein: 7.5 g/dL (ref 6.1–8.1)

## 2020-06-29 LAB — HEMOGLOBIN A1C
Hgb A1c MFr Bld: 8.1 % of total Hgb — ABNORMAL HIGH (ref <5.7)
Mean Plasma Glucose: 186 mg/dL
eAG (mmol/L): 10.3 mmol/L

## 2020-06-29 LAB — PSA: PSA: 6.17 ng/mL — ABNORMAL HIGH (ref <=4.0)

## 2020-06-29 MED ORDER — TAMSULOSIN HCL 0.4 MG PO CAPS: 0.4000 mg | ORAL_CAPSULE | Freq: Every day | ORAL | 3 refills | Status: DC

## 2020-06-29 MED ORDER — SITAGLIPTIN PHOSPHATE 100 MG PO TABS: 100.0000 mg | ORAL_TABLET | Freq: Every day | ORAL | 1 refills | Status: DC

## 2020-06-30 NOTE — Telephone Encounter (Signed)
This encounter was created in error - please disregard.

## 2020-07-05 ENCOUNTER — Other Ambulatory Visit (INDEPENDENT_AMBULATORY_CARE_PROVIDER_SITE_OTHER): Payer: Federal, State, Local not specified - PPO

## 2020-07-05 ENCOUNTER — Other Ambulatory Visit: Payer: Self-pay

## 2020-07-05 DIAGNOSIS — R350 Frequency of micturition: Secondary | ICD-10-CM

## 2020-07-05 LAB — POCT URINALYSIS DIPSTICK
Blood, UA: NEGATIVE
Glucose, UA: NEGATIVE
Leukocytes, UA: NEGATIVE
Nitrite, UA: NEGATIVE
Protein, UA: POSITIVE — AB
Spec Grav, UA: >=1.03 — AB (ref 1.010–1.025)
Urobilinogen, UA: 0.2 E.U./dL
pH, UA: 5 (ref 5.0–8.0)

## 2020-07-26 ENCOUNTER — Other Ambulatory Visit: Payer: Self-pay

## 2020-07-26 ENCOUNTER — Ambulatory Visit: Payer: Federal, State, Local not specified - PPO | Attending: Nurse Practitioner | Admitting: Physical Therapy

## 2020-07-26 DIAGNOSIS — M545 Low back pain, unspecified: Secondary | ICD-10-CM | POA: Insufficient documentation

## 2020-07-26 NOTE — Therapy (Addendum)
[facility] [address] [place], [place], [address] Phone: [phone]   Fax:  [phone]  Patient Details  Name: Darren Fisher MRN: [id number] Date of Birth: Feb 21, 1946 Referring Provider:  Sharon Seller, NP  Encounter Date: 07/26/2020  Pt arrived but did not want to proceed with eval due to insurance billing questions.   Jeri Cos, SPT 07/26/2020, 11:03 AM  [facility] [address] [place], [place], [address] Phone: [phone]   Fax:  [phone]

## 2020-10-25 ENCOUNTER — Other Ambulatory Visit: Payer: Self-pay | Admitting: Nurse Practitioner

## 2020-10-31 ENCOUNTER — Telehealth: Payer: Self-pay

## 2020-10-31 ENCOUNTER — Other Ambulatory Visit: Payer: Self-pay

## 2020-10-31 ENCOUNTER — Telehealth (INDEPENDENT_AMBULATORY_CARE_PROVIDER_SITE_OTHER): Payer: Medicare Other | Admitting: Nurse Practitioner

## 2020-10-31 DIAGNOSIS — U071 COVID-19: Secondary | ICD-10-CM | POA: Diagnosis not present

## 2020-10-31 NOTE — Telephone Encounter (Signed)
Mr. miquan, tandon are scheduled for a virtual visit with your provider today.    Just as we do with appointments in the office, we must obtain your consent to participate.  Your consent will be active for this visit and any virtual visit you may have with one of our providers in the next 365 days.    If you have a MyChart account, I can also send a copy of this consent to you electronically.  All virtual visits are billed to your insurance company just like a traditional visit in the office.  As this is a virtual visit, video technology does not allow for your provider to perform a traditional examination.  This may limit your provider's ability to fully assess your condition.  If your provider identifies any concerns that need to be evaluated in person or the need to arrange testing such as labs, EKG, etc, we will make arrangements to do so.    Although advances in technology are sophisticated, we cannot ensure that it will always work on either your end or our end.  If the connection with a video visit is poor, we may have to switch to a telephone visit.  With either a video or telephone visit, we are not always able to ensure that we have a secure connection.   I need to obtain your verbal consent now.   Are you willing to proceed with your visit today?   Boniface Goffe has provided verbal consent on 10/31/2020 for a virtual visit (video or telephone).   Elveria Royals, CMA 10/31/2020  10:20 AM

## 2020-10-31 NOTE — Progress Notes (Signed)
Careteam: Patient Care Team: Sharon Seller, NP as PCP - General (Geriatric Medicine)  Advanced Directive information    No Known Allergies  Chief Complaint  Patient presents with   Acute Visit    Patient positive for COVID. Tested positive yesterday. Patient having chills, fever, muscle spasms, weakness. No cough. Symptoms started yesterday. Patient has known COVID exposure last week.      HPI: Patient is a 75 y.o. male due to positive COVID test.  He has felt very weak and not a lot of control of bowels.  Feeling a little woozy. A little congestion but not much of a cough.  97.9 and 98.2 temperature. No fever.  No sore throat.  Some body aches in shoulder.  Has not eaten anything today.  Drinking minimally. Has not drank anything today.  Mild nausea but no vomiting.  Episode of diarrhea.   Reports the medication that he is taking is making him feel bloated and making him congested.  He eats less and does not lose weight.  Review of Systems:  Review of Systems  Constitutional:  Positive for chills and malaise/fatigue. Negative for fever and weight loss.  HENT:  Positive for congestion. Negative for tinnitus.   Respiratory:  Positive for cough (mild). Negative for sputum production and shortness of breath.   Cardiovascular:  Negative for chest pain, palpitations and leg swelling.  Gastrointestinal:  Positive for diarrhea and nausea. Negative for abdominal pain, constipation and heartburn.  Genitourinary:  Negative for dysuria, frequency and urgency.  Musculoskeletal:  Negative for back pain, falls, joint pain and myalgias.  Skin: Negative.   Neurological:  Positive for weakness. Negative for dizziness and headaches.   Past Medical History:  Diagnosis Date   History of colonoscopy    [facility]   History of MRI 04/01/2017   Hypertension    Wears glasses    Past Surgical History:  Procedure Laterality Date   INGUINAL HERNIA REPAIR Right  04/01/1965   Dr Nelma Rothman. [place] per [facility] new patient packet    INGUINAL HERNIA REPAIR Left 04/02/1991   [facility], Per [facility] New Patient Packet    Social History:   reports that he has never smoked. He has never used smokeless tobacco. He reports current alcohol use. He reports that he does not use drugs.  Family History  Problem Relation Age of Onset   Asthma Father    Other Sister    Diabetes Brother    Congestive Heart Failure Brother    Stomach cancer Sister    Obesity Brother     Medications: Patient's Medications  New Prescriptions   No medications on file  Previous Medications   AMLODIPINE (NORVASC) 10 MG TABLET    Take 1 tablet (10 mg total) by mouth daily.   DIPHENHYDRAMINE (BENADRYL) 25 MG TABLET    Take 25 mg by mouth as needed.   ELDERBERRY PO    Take 50 mg by mouth daily.    LOSARTAN (COZAAR) 100 MG TABLET    TAKE 1 TABLET(100 MG) BY MOUTH DAILY   MILK THISTLE 175 MG TABLET    Take 175 mg by mouth daily.   MULTIPLE VITAMINS-MINERALS (OCUVITE PO)    Take 1 capsule by mouth daily.    NUTRITIONAL SUPPLEMENTS (KIDNEY) CAPS    Take 2 capsules by mouth daily.   OMEGA-3 KRILL OIL 500 MG CAPS    Take 500 mg by mouth daily.   OMEPRAZOLE (PRILOSEC) 20 MG CAPSULE  Take 20 mg by mouth daily.   PHENYLEPHRINE-ACETAMINOPHEN 5-325 MG TABS    Take 2 tablets by mouth as needed.   ROSUVASTATIN (CRESTOR) 10 MG TABLET    Take 1 tablet (10 mg total) by mouth daily.   SITAGLIPTIN (JANUVIA) 100 MG TABLET    Take 1 tablet (100 mg total) by mouth daily.   TAMSULOSIN (FLOMAX) 0.4 MG CAPS CAPSULE    Take 1 capsule (0.4 mg total) by mouth daily.  Modified Medications   No medications on file  Discontinued Medications   No medications on file    Physical Exam:  There were no vitals filed for this visit. There is no height or weight on file to calculate BMI. Wt Readings from Last 3 Encounters:  06/28/20 300 lb (136.1 kg)  12/29/19 289 lb 6.4 oz (131.3 kg)   08/09/19 287 lb (130.2 kg)      Labs reviewed: Basic Metabolic Panel: Recent Labs    12/29/19 1403 02/16/20 1012 06/28/20 1415  NA 137 140 139  K 4.5 4.6 4.7  CL 102 103 103  CO2 25 30 29   GLUCOSE 108* 144* 142*  BUN 16 15 16   CREATININE 1.37* 1.30* 1.45*  CALCIUM 9.7 9.3 9.4   Liver Function Tests: Recent Labs    12/29/19 1403 02/16/20 1012 06/28/20 1415  AST 16 16 15   ALT 14 17 17   BILITOT 0.5 0.3 0.4  PROT 7.7 7.4 7.5   No results for input(s): LIPASE, AMYLASE in the last 8760 hours. No results for input(s): AMMONIA in the last 8760 hours. CBC: Recent Labs    12/29/19 1403 06/28/20 1415  WBC 7.1 7.4  NEUTROABS 4,026 4,373  HGB 13.0* 12.7*  HCT 40.7 40.0  MCV 80.8 80.3  PLT 290 282   Lipid Panel: Recent Labs    12/29/19 1403 02/16/20 1012  CHOL 205* 129  HDL 35* 34*  LDLCALC 142* 75  TRIG 150* 123  CHOLHDL 5.9* 3.8   TSH: No results for input(s): TSH in the last 8760 hours. A1C: Lab Results  Component Value Date   HGBA1C 8.1 (H) 06/28/2020     Assessment/Plan 1. COVID-19 -overall reports mild symptoms but has had little oral intake due to nausea. Encouraged proper hydration with replacing electrolytes if episode of diarrhea occurs with a low sugar sports drink.  -recommended to take Vit C 1000 mg twice daily, Vit D 5000 units daily and zinc 50 mg daily for 7 days -tylenol 650 mg by mouth every 8 hours as needed fever/body aches.  -mucinex DM twice daily as needed for cough and chest congestion -do not sit in bed all day, sit up in chair and walk around as tolerated -nasal wash daily and nasal saline PRN.  -education provided on when to follow up and when to seek immediate medication attention through the ED.   2. DM type 2 No hypoglycemia noted but he has not taken blood sugar. Encouraged monitoring   Next appt: 11/10/2020 as scheduled To follow up sooner if needed Waris Rodger K. 12/31/19  [facility] [phone]    Virtual Visit via mychart video  I connected with patient on 10/31/20 at  9:30 AM EDT by video and verified that I am speaking with the correct person using two identifiers.  Location: Patient: in the car- passenger  Provider: [facility].   I discussed the limitations, risks, security and privacy concerns of performing an evaluation and management service by telephone and  the availability of in person appointments. I also discussed with the patient that there may be a patient responsible charge related to this service. The patient expressed understanding and agreed to proceed.   I discussed the assessment and treatment plan with the patient. The patient was provided an opportunity to ask questions and all were answered. The patient agreed with the plan and demonstrated an understanding of the instructions.   The patient was advised to call back or seek an in-person evaluation if the symptoms worsen or if the condition fails to improve as anticipated.  I provided 25 minutes of non-face-to-face time during this encounter.  Janene Harvey. Biagio Borg Avs printed and mailed

## 2020-10-31 NOTE — Progress Notes (Signed)
This service is provided via telemedicine  No vital signs collected/recorded due to the encounter was a telemedicine visit.   Location of patient (ex: home, work):  [place]   Patient consents to a telephone visit:  Yes, see encounter dated 10/31/2020  Location of the provider (ex: office, home):  [facility]   Name of any referring provider:  N/A  Names of all persons participating in the telemedicine service and their role in the encounter:  Abbey Chatters, Nurse Practitioner, Elveria Royals, CMA, patient and wife.  Time spent on call:  9 minutes with medical assistant

## 2020-11-08 ENCOUNTER — Other Ambulatory Visit: Payer: Self-pay | Admitting: Nurse Practitioner

## 2020-11-08 DIAGNOSIS — N401 Enlarged prostate with lower urinary tract symptoms: Secondary | ICD-10-CM

## 2020-11-08 DIAGNOSIS — R351 Nocturia: Secondary | ICD-10-CM

## 2020-11-10 ENCOUNTER — Ambulatory Visit (INDEPENDENT_AMBULATORY_CARE_PROVIDER_SITE_OTHER): Payer: Medicare Other | Admitting: Nurse Practitioner

## 2020-11-10 ENCOUNTER — Other Ambulatory Visit: Payer: Self-pay

## 2020-11-10 ENCOUNTER — Encounter: Payer: Self-pay | Admitting: Nurse Practitioner

## 2020-11-10 VITALS — BP 130/80 | Temp 97.3°F | Ht 72.0 in | Wt 288.6 lb

## 2020-11-10 DIAGNOSIS — I10 Essential (primary) hypertension: Secondary | ICD-10-CM | POA: Diagnosis not present

## 2020-11-10 DIAGNOSIS — Z23 Encounter for immunization: Secondary | ICD-10-CM | POA: Diagnosis not present

## 2020-11-10 DIAGNOSIS — E1140 Type 2 diabetes mellitus with diabetic neuropathy, unspecified: Secondary | ICD-10-CM | POA: Diagnosis not present

## 2020-11-10 DIAGNOSIS — E782 Mixed hyperlipidemia: Secondary | ICD-10-CM | POA: Diagnosis not present

## 2020-11-10 DIAGNOSIS — E1122 Type 2 diabetes mellitus with diabetic chronic kidney disease: Secondary | ICD-10-CM | POA: Diagnosis not present

## 2020-11-10 DIAGNOSIS — R972 Elevated prostate specific antigen [PSA]: Secondary | ICD-10-CM

## 2020-11-10 DIAGNOSIS — N183 Chronic kidney disease, stage 3 unspecified: Secondary | ICD-10-CM

## 2020-11-10 DIAGNOSIS — M5136 Other intervertebral disc degeneration, lumbar region: Secondary | ICD-10-CM

## 2020-11-10 NOTE — Progress Notes (Signed)
Careteam: Patient Care Team: Lauree Chandler, NP as PCP - General (Geriatric Medicine)  PLACE OF SERVICE:  [place] Directive information    No Known Allergies  Chief Complaint  Patient presents with   Acute Visit    Patient states having difficulty breathing after starting [place]. Patient states that sinus congestion seems to be getting worse with medication. He started taking 1/2 tablet about a month ago and seemed to help.Patient tested positive for COVID 10/30/2020     HPI: Patient is a 75 y.o. male for follow up. Missed 4 month follow up appt.   Had COVID last week. Was mostly weak for 24 hours but has improved overall getting energy back. Did not have cough or congestion.   He started [place] right before he moved.  Has always had sinus issues, congestion.  Always gets better when he comes down to [place] but this time it was worse. He noticed sinus issues were worse.  He stopped taking the 100 mg and started taking the 50 mg and feels like it has gotten better.  He reports he was not aware he had diabetes. Reports he can do better with his diet.   Started flomax and that helped urinary frequency.  Psa mildly elevated on last labs. Will need follow up today.  He has DDD and then was told he may need hip replacement. Feels like he was told different things by different.   Review of Systems:  Review of Systems  Constitutional:  Negative for chills, fever, malaise/fatigue and weight loss.  HENT:  Negative for tinnitus.   Respiratory:  Negative for cough, sputum production and shortness of breath.   Cardiovascular:  Negative for chest pain, palpitations and leg swelling.  Gastrointestinal:  Negative for abdominal pain, constipation, diarrhea and heartburn.  Genitourinary:  Positive for frequency. Negative for dysuria and urgency.  Musculoskeletal:  Positive for back pain. Negative for falls, joint pain and myalgias.  Skin: Negative.   Neurological:   Negative for dizziness and headaches.  Psychiatric/Behavioral:  Negative for depression and memory loss. The patient does not have insomnia.    Past Medical History:  Diagnosis Date   History of colonoscopy    [facility]   History of MRI 04/01/2017   Hypertension    Wears glasses    Past Surgical History:  Procedure Laterality Date   INGUINAL HERNIA REPAIR Right 04/01/1965   Dr Vassie Moment. Richardson per [facility] new patient packet    INGUINAL HERNIA REPAIR Left 04/02/1991   [facility], Per [facility] New Patient Packet    Social History:   reports that he has never smoked. He has never used smokeless tobacco. He reports current alcohol use. He reports that he does not use drugs.  Family History  Problem Relation Age of Onset   Asthma Father    Other Sister    Diabetes Brother    Congestive Heart Failure Brother    Stomach cancer Sister    Obesity Brother     Medications: Patient's Medications  New Prescriptions   No medications on file  Previous Medications   AMLODIPINE (NORVASC) 10 MG TABLET    Take 1 tablet (10 mg total) by mouth daily.   DIPHENHYDRAMINE (BENADRYL) 25 MG TABLET    Take 25 mg by mouth as needed.   ELDERBERRY PO    Take 50 mg by mouth daily.   LOSARTAN (COZAAR) 100 MG TABLET    TAKE 1 TABLET(100 MG) BY  MOUTH DAILY   MILK THISTLE 175 MG TABLET    Take 175 mg by mouth daily.   MULTIPLE VITAMINS-MINERALS (OCUVITE PO)    Take 1 capsule by mouth daily.    NUTRITIONAL SUPPLEMENTS (KIDNEY) CAPS    Take 2 capsules by mouth daily.   OMEGA-3 KRILL OIL 500 MG CAPS    Take 500 mg by mouth daily.   OMEPRAZOLE (PRILOSEC) 20 MG CAPSULE    Take 20 mg by mouth daily.   PHENYLEPHRINE-ACETAMINOPHEN 5-325 MG TABS    Take 2 tablets by mouth as needed.   ROSUVASTATIN (CRESTOR) 10 MG TABLET    Take 1 tablet (10 mg total) by mouth daily.   SITAGLIPTIN (JANUVIA) 100 MG TABLET    Take 100 mg by mouth daily. Patient taking 1/2 tablet   TAMSULOSIN (FLOMAX) 0.4  MG CAPS CAPSULE    TAKE 1 CAPSULE(0.4 MG) BY MOUTH DAILY  Modified Medications   No medications on file  Discontinued Medications   SITAGLIPTIN (JANUVIA) 100 MG TABLET    Take 1 tablet (100 mg total) by mouth daily.    Physical Exam:  Vitals:   11/10/20 1134  BP: 130/80  Temp: (!) 97.3 F (36.3 C)  TempSrc: Temporal  SpO2: 98%  Weight: 288 lb 9.6 oz (130.9 kg)  Height: 6' (1.829 m)   Body mass index is 39.14 kg/m. Wt Readings from Last 3 Encounters:  11/10/20 288 lb 9.6 oz (130.9 kg)  06/28/20 300 lb (136.1 kg)  12/29/19 289 lb 6.4 oz (131.3 kg)    Physical Exam Constitutional:      General: He is not in acute distress.    Appearance: He is well-developed. He is not diaphoretic.  HENT:     Head: Normocephalic and atraumatic.     Right Ear: External ear normal.     Left Ear: External ear normal.     Mouth/Throat:     Pharynx: No oropharyngeal exudate.  Eyes:     Conjunctiva/sclera: Conjunctivae normal.     Pupils: Pupils are equal, round, and reactive to light.  Cardiovascular:     Rate and Rhythm: Normal rate and regular rhythm.     Heart sounds: Normal heart sounds.  Pulmonary:     Effort: Pulmonary effort is normal.     Breath sounds: Normal breath sounds.  Abdominal:     General: Bowel sounds are normal.     Palpations: Abdomen is soft.  Musculoskeletal:        General: No tenderness.     Cervical back: Normal range of motion and neck supple.     Right lower leg: No edema.     Left lower leg: No edema.  Skin:    General: Skin is warm and dry.  Neurological:     Mental Status: He is alert and oriented to person, place, and time.    Labs reviewed: Basic Metabolic Panel: Recent Labs    12/29/19 1403 02/16/20 1012 06/28/20 1415  NA 137 140 139  K 4.5 4.6 4.7  CL 102 103 103  CO2 25 30 29   GLUCOSE 108* 144* 142*  BUN 16 15 16   CREATININE 1.37* 1.30* 1.45*  CALCIUM 9.7 9.3 9.4   Liver Function Tests: Recent Labs    12/29/19 1403  02/16/20 1012 06/28/20 1415  AST 16 16 15   ALT 14 17 17   BILITOT 0.5 0.3 0.4  PROT 7.7 7.4 7.5   No results for input(s): LIPASE, AMYLASE in the last 8760 hours. No results  for input(s): AMMONIA in the last 8760 hours. CBC: Recent Labs    12/29/19 1403 06/28/20 1415  WBC 7.1 7.4  NEUTROABS 4,026 4,373  HGB 13.0* 12.7*  HCT 40.7 40.0  MCV 80.8 80.3  PLT 290 282   Lipid Panel: Recent Labs    12/29/19 1403 02/16/20 1012  CHOL 205* 129  HDL 35* 34*  LDLCALC 142* 75  TRIG 150* 123  CHOLHDL 5.9* 3.8   TSH: No results for input(s): TSH in the last 8760 hours. A1C: Lab Results  Component Value Date   HGBA1C 8.1 (H) 06/28/2020     Assessment/Plan 1. Essential hypertension --stable. Goal bp <140/90. Continue on norvasc with low sodium diet.  - CMP with eGFR([facility]) - CBC with Differential/Platelet  2. Mixed hyperlipidemia -continues on crestor. LDL goal <70.  - Lipid Panel  3. Type 2 diabetes mellitus with diabetic neuropathy, without long-term current use of insulin ([place]) -discussed with the patient the pathophysiology of diabetes and the natural progression of the disease.  -stressed the importance of lifestyle changes including diet and exercise. -discussed complications associated with diabetes including retinopathy, nephropathy, neuropathy as well as increased risk of cardiovascular disease. We went over the benefit seen with glycemic control.   -Encouraged dietary compliance, routine foot care/monitoring and to keep up with diabetic eye exams through ophthalmology  -he can not tolerate januvia at 100 mg. He will likely need an add on therapy.  -will get A1c and will make changes accordingly. He would prefer a non-injection. Consider farxiga.  - Hemoglobin A1c  4. CKD stage 3 secondary to diabetes [facility]) -Encourage proper hydration and to avoid NSAIDS (Aleve, Advil, Motrin, Ibuprofen)   5. Elevated PSA -will follow up PSA at this time. If remains elevated  consider urology referral for further evaluation.  - PSA  6. Need for pneumococcal vaccination - Pneumococcal conjugate vaccine 13-valent  7. DDD (degenerative disc disease), lumbar -ongoing. Has seen several specialist in the past without benefit.  - Ambulatory referral to Physical Therapy to evaluate and treat at this time.   8. Obesity -education provided on healthy weight loss through increase in physical activity and proper nutrition   Next appt: 3 months, labs prior  Allsion Nogales K. Clayton, [facility] [phone]

## 2020-11-11 LAB — COMPLETE METABOLIC PANEL WITH GFR
AG Ratio: 1.6 (calc) (ref 1.0–2.5)
ALT: 16 U/L (ref 9–46)
AST: 15 U/L (ref 10–35)
Albumin: 4.4 g/dL (ref 3.6–5.1)
Alkaline phosphatase (APISO): 86 U/L (ref 35–144)
BUN: 12 mg/dL (ref 7–25)
CO2: 26 mmol/L (ref 20–32)
Calcium: 8.9 mg/dL (ref 8.6–10.3)
Chloride: 105 mmol/L (ref 98–110)
Creat: 1.17 mg/dL (ref 0.70–1.28)
Globulin: 2.7 g/dL (calc) (ref 1.9–3.7)
Glucose, Bld: 117 mg/dL — ABNORMAL HIGH (ref 65–99)
Potassium: 4.3 mmol/L (ref 3.5–5.3)
Sodium: 140 mmol/L (ref 135–146)
Total Bilirubin: 0.4 mg/dL (ref 0.2–1.2)
Total Protein: 7.1 g/dL (ref 6.1–8.1)
eGFR: 65 mL/min/{1.73_m2} (ref >=60)

## 2020-11-11 LAB — CBC WITH DIFFERENTIAL/PLATELET
Absolute Monocytes: 554 cells/uL (ref 200–950)
Basophils Absolute: 19 cells/uL (ref 0–200)
Basophils Relative: 0.3 %
Eosinophils Absolute: 221 cells/uL (ref 15–500)
Eosinophils Relative: 3.5 %
HCT: 40.6 % (ref 38.5–50.0)
Hemoglobin: 12.7 g/dL — ABNORMAL LOW (ref 13.2–17.1)
Lymphs Abs: 1896 cells/uL (ref 850–3900)
MCH: 25.7 pg — ABNORMAL LOW (ref 27.0–33.0)
MCHC: 31.3 g/dL — ABNORMAL LOW (ref 32.0–36.0)
MCV: 82 fL (ref 80.0–100.0)
MPV: 9.2 fL (ref 7.5–12.5)
Monocytes Relative: 8.8 %
Neutro Abs: 3610 cells/uL (ref 1500–7800)
Neutrophils Relative %: 57.3 %
Platelets: 325 10*3/uL (ref 140–400)
RBC: 4.95 10*6/uL (ref 4.20–5.80)
RDW: 14.4 % (ref 11.0–15.0)
Total Lymphocyte: 30.1 %
WBC: 6.3 10*3/uL (ref 3.8–10.8)

## 2020-11-11 LAB — LIPID PANEL
Cholesterol: 108 mg/dL (ref <200)
HDL: 31 mg/dL — ABNORMAL LOW (ref >=40)
LDL Cholesterol (Calc): 57 mg/dL (calc)
Non-HDL Cholesterol (Calc): 77 mg/dL (calc) (ref <130)
Total CHOL/HDL Ratio: 3.5 (calc) (ref <5.0)
Triglycerides: 117 mg/dL (ref <150)

## 2020-11-11 LAB — HEMOGLOBIN A1C
Hgb A1c MFr Bld: 7.6 % of total Hgb — ABNORMAL HIGH (ref <5.7)
Mean Plasma Glucose: 171 mg/dL
eAG (mmol/L): 9.5 mmol/L

## 2020-11-11 LAB — PSA: PSA: 7.15 ng/mL — ABNORMAL HIGH (ref <=4.00)

## 2020-11-13 ENCOUNTER — Other Ambulatory Visit: Payer: Self-pay | Admitting: Nurse Practitioner

## 2020-11-13 DIAGNOSIS — E1140 Type 2 diabetes mellitus with diabetic neuropathy, unspecified: Secondary | ICD-10-CM

## 2020-11-13 DIAGNOSIS — R972 Elevated prostate specific antigen [PSA]: Secondary | ICD-10-CM

## 2020-11-13 MED ORDER — DAPAGLIFLOZIN PROPANEDIOL 5 MG PO TABS
5.0000 mg | ORAL_TABLET | Freq: Every day | ORAL | 3 refills | Status: DC
Start: 2020-11-13 — End: 2023-02-21

## 2020-11-20 ENCOUNTER — Telehealth: Payer: Self-pay

## 2020-11-20 NOTE — Telephone Encounter (Addendum)
Patient called stating that he is needing prior authorization for Farxiga.Will need to call [employer] to obtain prior authorization [phone]

## 2020-11-21 NOTE — Telephone Encounter (Signed)
I called [employer] prior authorization department regarding Darren Fisher. The Darren Fisher was ot approved and they will be sending out letter with further steps that may be taking for approval of medication

## 2020-11-22 ENCOUNTER — Ambulatory Visit: Payer: Medicare Other | Attending: Nurse Practitioner

## 2020-11-22 ENCOUNTER — Other Ambulatory Visit: Payer: Self-pay

## 2020-11-22 DIAGNOSIS — M545 Low back pain, unspecified: Secondary | ICD-10-CM | POA: Insufficient documentation

## 2020-11-22 DIAGNOSIS — M25551 Pain in right hip: Secondary | ICD-10-CM | POA: Diagnosis present

## 2020-11-22 DIAGNOSIS — G8929 Other chronic pain: Secondary | ICD-10-CM | POA: Insufficient documentation

## 2020-11-22 DIAGNOSIS — M6281 Muscle weakness (generalized): Secondary | ICD-10-CM

## 2020-11-22 NOTE — Therapy (Signed)
[facility] [address] [place], [place], [address] Phone: [phone]   Fax:  [phone]  Physical Therapy Evaluation  Patient Details  Name: Darren Fisher MRN: [id number] Date of Birth: [birth year] Referring Provider (PT): Sharon Seller   Encounter Date: 11/22/2020   PT End of Session - 11/22/20 1429     Visit Number 1    Number of Visits 9    Date for PT Re-Evaluation 01/17/21    Authorization Type MCR/ BCBS    Authorization Time Period FOTO v6, v10    Progress Note Due on Visit 10    PT Start Time 1130    PT Stop Time 1215    PT Time Calculation (min) 45 min    Activity Tolerance Patient tolerated treatment well    Behavior During Therapy [facility] for tasks assessed/performed             Past Medical History:  Diagnosis Date   History of colonoscopy    [facility] [place] [facility]   History of MRI 04/01/2017   Hypertension    Wears glasses     Past Surgical History:  Procedure Laterality Date   INGUINAL HERNIA REPAIR Right 04/01/1965   Dr Nelma Rothman. [place] per [facility] new patient packet    INGUINAL HERNIA REPAIR Left 04/02/1991   [facility], Per [facility] New Patient Packet     There were no vitals filed for this visit.    Subjective Assessment - 11/22/20 1136     Subjective Pt reports primary c/o chronic LBP/ R hip pain of insidious onset lasting >35-40 years. He states that the pain has gotten worse over the past several years. He reports having one corticosteroid injection about 10 years ago, which did not help with his plan. He also reports occasional L LE N/T not going past his knee. The pt reports missing being able to do his own yard work and exercise without pain. Pt denies any unexplained weight loss/ gain, nausea/ vomiting, unrelenting night pain, or N/T in the groin region. He also denies any hx of lumbar or hip injuries.    How long can you sit comfortably? Unlimited    How long can  you stand comfortably? 15-20 minutes    How long can you walk comfortably? Unlimited    Diagnostic tests Pt reports having a lumbar MRI around 2015, imaging unavilable    Patient Stated Goals Return to exercise, household activities    Currently in Pain? 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  No    Is the patient reluctant to leave their home because of a fear of falling?  No  Prior Function   Level of Independence Independent    Vocation Retired      Copy Status Within Functional Limits for tasks assessed      Observation/Other Assessments   Focus on Therapeutic Outcomes (FOTO)  59%, projected 66% by visit 11      AROM   Lumbar Flexion 100%    Lumbar Extension 100%    Lumbar - Right Side Bend 100%    Lumbar - Left Side Bend 100%    Lumbar - Right Rotation 80%    Lumbar - Left Rotation 60%      Strength   Right Hip Flexion 4+/5    Right Hip Extension 4+/5    Right Hip External Rotation  5/5    Right Hip Internal Rotation 5/5    Right Hip ABduction 4+/5    Right Hip ADduction 5/5    Left Hip Flexion 4+/5    Left Hip Extension 4+/5    Left Hip External Rotation 5/5    Left Hip Internal Rotation 5/5    Left Hip ABduction 4+/5    Left Hip ADduction 5/5      Flexibility   Soft Tissue Assessment /Muscle Length --   Mild limitation in hip flexor  extensibility BIL (+ Thomas test)     Palpation   Spinal mobility Hypomobile CPA's at T10-L2 with pain at L1-2, hypermobile at L3-5 with no pain    Palpation comment No TTP to BIL QL or lumbar paraspinals      Special Tests    Special Tests Lumbar;Hip Special Tests    Lumbar Tests Slump Test;Straight Leg Raise;other;other2    Hip Special Tests  Luisa Hart (FABER) Test;Thomas Test      Slump test   Findings Negative    Comment BIL      Straight Leg Raise   Findings Negative    Comment BIL      other   Comments Repeated lumbar extension      other   Findings Negative    Comment ASLR, BIL      Luisa Hart (FABER) Test   Findings Negative    Comments BIL      Thomas Test    Findings Positive    Comments BIL      Transfers   Five time sit to stand comments  13sec                        Objective measurements completed on examination: See above findings.               PT Education - 11/22/20 1428     Education Details Pt educated about potential underlying physiology behind his chronic LBP.    Person(s) Educated Patient    Methods Explanation    Comprehension Verbalized understanding              PT Short Term Goals - 11/22/20 1444       PT SHORT TERM GOAL #1   Title Pt will report understanding and adherence to his HEP in order to promote independence in the management of his primary impairments.    Baseline HEP given at baseline    Time 4    Period Weeks    Status New    Target Date 12/20/20               PT Long Term Goals - 11/22/20 1604       PT  LONG TERM GOAL #1   Title The pt will demonstrate WNL trunk rotation AROM BIL in order to perform yardwork without limitation.    Baseline 60% to L, 80% to R    Time 4    Period Weeks    Status New    Target Date 01/17/21      PT LONG TERM GOAL #2   Title Pt will report ability to stand >30 minutes with 0-2/10 pain in order to grocery shop without limitation.    Baseline Pt  experiences >4/10 pain with standing >15-20 minutes    Time 8    Period Weeks    Status New    Target Date 01/17/21      PT LONG TERM GOAL #3   Title Pt will achieve a FOTO score of 66% in order to demonstrate improved functional ability as it relates to his lumbar impairments.    Baseline 59%    Time 8    Period Weeks    Status New    Target Date 01/17/21      PT LONG TERM GOAL #4   Title Pt will demonstrate BIL hip flexion, extension, and abduction MMT of 5/5 in order to return to independent exercise program without limitation.    Baseline 4+/5 with each MMT    Time 8    Period Weeks    Status New    Target Date 01/17/21                    Plan - 11/22/20 1430     Clinical Impression Statement Pt is a pleasant 75yo M who presents with primary c/o chronic LBP and R hip pain of insidious onset lasting >35 years. Upon assessment, his primary impairments include hypomobile and painful lower thoracic and upper lumbar passive accessories, hypermobile lower lumbar passive accessories, limiated BIL lumbar rotation AROM, tight BIL hip flexors, and minor weakness in bil hip flexion, abduction, and extension. His pain was not reproduced with active movement, but only with lumbar passive accessories. Red flag screening was negative, which lowers concern for non-MSK involvement. His sxs are most indicative of non-specific LBP vs. lumbar spinal instability. Although pt reports occasional N/T down the R LE, unable to reproduce radicular sxs with special testing. The pt will benefit from skilled PT to address his primary impairments and return to his prior level of function without limitation.    Personal Factors and Comorbidities Comorbidity 1;Time since onset of injury/illness/exacerbation    Comorbidities HTN    Examination-Activity Limitations Stand;Stairs;Locomotion Level    Examination-Participation Restrictions Cleaning;Community Activity;Yard Work    Stability/Clinical Decision  Making Stable/Uncomplicated    Optometrist Low    Rehab Potential Good    PT Frequency 1x / week    PT Duration 8 weeks    PT Treatment/Interventions Taping;Passive range of motion;Dry needling;Spinal Manipulations;Joint Manipulations;Manual techniques;Neuromuscular re-education;Therapeutic exercise;Therapeutic activities;Patient/family education;Stair training;Gait training;Moist Heat;Traction;Biofeedback;Electrical Stimulation;Cryotherapy    PT Next Visit Plan administer HEP, Progress core/ hip exercises, plank testing / PLE to rule in lumbar instability    PT Home Exercise Plan Administer at next visit    Consulted and Agree with Plan of Care Patient             Patient will benefit from skilled therapeutic intervention in order to improve the following deficits and impairments:  Decreased range of motion, Hypermobility, Pain, Impaired flexibility, Hypomobility, Decreased strength  Visit Diagnosis: Chronic bilateral low back pain, unspecified whether sciatica present  Pain in right hip  Muscle weakness (generalized)     Problem List Patient Active Problem List   Diagnosis Date Noted   BPH (benign prostatic hyperplasia) 06/29/2020   Essential hypertension 12/29/2019   Mixed hyperlipidemia 12/29/2019   Seasonal allergies 12/29/2019   Type 2 diabetes mellitus with diabetic neuropathy, without long-term current use of insulin (HCC) 12/29/2019   Morbid obesity (HCC) 12/29/2019    Carmelina DaneYarborough, Lisandro Meggett, PT, DPT 11/22/20 4:11 PM   [facility] [address] [place], [place], [address] Phone: [phone]   Fax:  [phone]  Name: Darren Fisher MRN: [id number] Date of Birth: 08/28/1945

## 2020-11-22 NOTE — Telephone Encounter (Signed)
Fax paper received from [facility]. Patient medication "Darren Fisher" was not approved as previously stated by Elveria Royals, CMA. Placed fax paper with further instructions into Rx folder on PCP Janyth Contes, Janene Harvey, NP desk for further evaluation.

## 2020-11-29 ENCOUNTER — Other Ambulatory Visit: Payer: Self-pay

## 2020-11-29 ENCOUNTER — Ambulatory Visit: Payer: Medicare Other

## 2020-11-29 DIAGNOSIS — M25551 Pain in right hip: Secondary | ICD-10-CM

## 2020-11-29 DIAGNOSIS — G8929 Other chronic pain: Secondary | ICD-10-CM

## 2020-11-29 DIAGNOSIS — M6281 Muscle weakness (generalized): Secondary | ICD-10-CM

## 2020-11-29 DIAGNOSIS — M545 Low back pain, unspecified: Secondary | ICD-10-CM

## 2020-11-29 NOTE — Therapy (Signed)
[facility] Outpatient Rehabilitation [facility] [address] [place], [place], [address] Phone: [phone]   Fax:  [phone]  Physical Therapy Treatment  Patient Details  Name: Darren Fisher MRN: [id number] Date of Birth: [birth year] Referring Provider (PT): Sharon Seller   Encounter Date: 11/29/2020   PT End of Session - 11/29/20 1240     Visit Number 2    Number of Visits 9    Date for PT Re-Evaluation 01/17/21    Authorization Type MCR/ BCBS    Authorization Time Period FOTO v6, v10    Progress Note Due on Visit 10    PT Start Time 1215    PT Stop Time 1300    PT Time Calculation (min) 45 min    Activity Tolerance Patient tolerated treatment well    Behavior During Therapy [facility] for tasks assessed/performed             Past Medical History:  Diagnosis Date   History of colonoscopy    [facility] [place] [facility]   History of MRI 04/01/2017   Hypertension    Wears glasses     Past Surgical History:  Procedure Laterality Date   INGUINAL HERNIA REPAIR Right 04/01/1965   Dr Nelma Rothman. [place] per [facility] new patient packet    INGUINAL HERNIA REPAIR Left 04/02/1991   [facility], Per [facility] New Patient Packet     There were no vitals filed for this visit.   Subjective Assessment - 11/29/20 1216     Subjective Pt reports no pain today, adding that he is ready to initiate his first treatment session.    Currently in Pain? No/denies    Pain Score 0-No pain                               [facility] Adult PT Treatment/Exercise - 11/29/20 0001       Lumbar Exercises: Stretches   Other Lumbar Stretch Exercise Cobra stretch x10 with 3sec hold      Lumbar Exercises: Standing   Other Standing Lumbar Exercises Pallof press with GTB 2x10 BIL      Lumbar Exercises: Supine   Bridge with March --   2x8 BIL   Large Ball Abdominal Isometric Other (comment)   3x30sec with green physioball   Other Supine Lumbar Exercises  Curl up's with reach 3x10      Lumbar Exercises: Sidelying   Hip Abduction Both   2x10 BIL                   PT Education - 11/29/20 1240     Education Details Educated on proper form with HEP    Person(s) Educated Patient    Methods Explanation;Demonstration;Handout    Comprehension Verbalized understanding;Returned demonstration              PT Short Term Goals - 11/22/20 1444       PT SHORT TERM GOAL #1   Title Pt will report understanding and adherence to his HEP in order to promote independence in the management of his primary impairments.    Baseline HEP given at baseline    Time 4    Period Weeks    Status New    Target Date 12/20/20               PT Long Term Goals - 11/22/20 1604       PT LONG TERM GOAL #1  Title The pt will demonstrate WNL trunk rotation AROM BIL in order to perform yardwork without limitation.    Baseline 60% to L, 80% to R    Time 4    Period Weeks    Status New    Target Date 01/17/21      PT LONG TERM GOAL #2   Title Pt will report ability to stand >30 minutes with 0-2/10 pain in order to grocery shop without limitation.    Baseline Pt experiences >4/10 pain with standing >15-20 minutes    Time 8    Period Weeks    Status New    Target Date 01/17/21      PT LONG TERM GOAL #3   Title Pt will achieve a FOTO score of 66% in order to demonstrate improved functional ability as it relates to his lumbar impairments.    Baseline 59%    Time 8    Period Weeks    Status New    Target Date 01/17/21      PT LONG TERM GOAL #4   Title Pt will demonstrate BIL hip flexion, extension, and abduction MMT of 5/5 in order to return to independent exercise program without limitation.    Baseline 4+/5 with each MMT    Time 8    Period Weeks    Status New    Target Date 01/17/21                   Plan - 11/29/20 1247     Clinical Impression Statement Pt responded well to all interventions today, demonstrating  proper form and no increase in pain with any selected exercises. He reports feeling a "good burn" with the core exercises today. Additionally, about 30 minutes into the treatment session, the pt asks the therapist if he should have eaten something before coming to his treatment since he is diabetic. He reports feeling minor lightheadedness at this time. The PT advised him to always eat before coming to his sessions. Animal crackers and water were also given to the pt to elevate his blood sugar. He returned to his baseline with no dizziness or lightheadedness and was able to finish the session without difficulty. He will continue to benefit from skilled PT to address his primary impairments and return to his prior level of function without limitation.    Personal Factors and Comorbidities Comorbidity 1;Time since onset of injury/illness/exacerbation    Comorbidities HTN    Examination-Activity Limitations Stand;Stairs;Locomotion Level    Examination-Participation Restrictions Cleaning;Community Activity;Yard Work    Stability/Clinical Decision Making Stable/Uncomplicated    Optometrist Low    Rehab Potential Good    PT Frequency 1x / week    PT Duration 8 weeks    PT Treatment/Interventions Taping;Passive range of motion;Dry needling;Spinal Manipulations;Joint Manipulations;Manual techniques;Neuromuscular re-education;Therapeutic exercise;Therapeutic activities;Patient/family education;Stair training;Gait training;Moist Heat;Traction;Biofeedback;Electrical Stimulation;Cryotherapy    PT Next Visit Plan Progress core/ hip exercises, plank testing / PLE to rule in lumbar instability    PT Home Exercise Plan [id number]    Consulted and Agree with Plan of Care Patient             Patient will benefit from skilled therapeutic intervention in order to improve the following deficits and impairments:  Decreased range of motion, Hypermobility, Pain, Impaired flexibility, Hypomobility, Decreased  strength  Visit Diagnosis: Chronic bilateral low back pain, unspecified whether sciatica present  Pain in right hip  Muscle weakness (generalized)  Low back pain, unspecified back pain laterality,  unspecified chronicity, unspecified whether sciatica present     Problem List Patient Active Problem List   Diagnosis Date Noted   BPH (benign prostatic hyperplasia) 06/29/2020   Essential hypertension 12/29/2019   Mixed hyperlipidemia 12/29/2019   Seasonal allergies 12/29/2019   Type 2 diabetes mellitus with diabetic neuropathy, without long-term current use of insulin (HCC) 12/29/2019   Morbid obesity (HCC) 12/29/2019    [name], PT, DPT 11/29/20 12:55 PM   [facility] [address] [place], [place], [address] Phone: [phone]   Fax:  [phone]  Name: Gadiel John MRN: [id number] Date of Birth: 1945-04-26

## 2020-11-29 NOTE — Patient Instructions (Signed)
  [id number]

## 2020-12-06 ENCOUNTER — Ambulatory Visit: Payer: Medicare Other | Attending: Nurse Practitioner

## 2020-12-06 ENCOUNTER — Other Ambulatory Visit: Payer: Self-pay

## 2020-12-06 DIAGNOSIS — M6281 Muscle weakness (generalized): Secondary | ICD-10-CM | POA: Insufficient documentation

## 2020-12-06 DIAGNOSIS — M545 Low back pain, unspecified: Secondary | ICD-10-CM | POA: Diagnosis present

## 2020-12-06 DIAGNOSIS — G8929 Other chronic pain: Secondary | ICD-10-CM | POA: Diagnosis present

## 2020-12-06 DIAGNOSIS — M25551 Pain in right hip: Secondary | ICD-10-CM | POA: Diagnosis present

## 2020-12-06 NOTE — Therapy (Signed)
[facility] Outpatient Rehabilitation [facility] [address] [place], [place], [address] Phone: [phone]   Fax:  [phone]  Physical Therapy Treatment  Patient Details  Name: Darren Fisher MRN: [id number] Date of Birth: 12/31/1945 Referring Provider (PT): Sharon Seller   Encounter Date: 12/06/2020   PT End of Session - 12/06/20 1158     Visit Number 3    Number of Visits 9    Date for PT Re-Evaluation 01/17/21    Authorization Type MCR/ BCBS    Authorization Time Period FOTO v6, v10    Progress Note Due on Visit 10    PT Start Time 1130    PT Stop Time 1225   10 minutes heat   PT Time Calculation (min) 55 min    Activity Tolerance Patient tolerated treatment well    Behavior During Therapy [facility] for tasks assessed/performed             Past Medical History:  Diagnosis Date   History of colonoscopy    [facility] [place] [facility]   History of MRI 04/01/2017   Hypertension    Wears glasses     Past Surgical History:  Procedure Laterality Date   INGUINAL HERNIA REPAIR Right 04/01/1965   Dr Nelma Rothman. [place] per [facility] new patient packet    INGUINAL HERNIA REPAIR Left 04/02/1991   [facility], Per [facility] New Patient Packet     There were no vitals filed for this visit.   Subjective Assessment - 12/06/20 1130     Subjective Pt reports "missing a step" when walking in the mall with his wife on Saturday, which resulted in acute LBP and stiffness. He reports that it is slowly getting better since onset, but the pain has limited him with his HEP.    Currently in Pain? Yes    Pain Score 4     Pain Location Back    Pain Orientation Lower    Pain Descriptors / Indicators Aching    Pain Type Acute pain    Pain Onset In the past 7 days    Pain Frequency Constant                [facility] PT Assessment - 12/06/20 0001       Palpation   Spinal mobility Hypomobile CPA's at T10-L2 with pain at L1-2, mildy hypermobile at L3-5 with  no pain    Palpation comment TTP to R QL and lumbar paraspinals                           [facility] Adult PT Treatment/Exercise - 12/06/20 0001       Lumbar Exercises: Stretches   Other Lumbar Stretch Exercise Cobra stretch x10 with 3sec hold      Lumbar Exercises: Standing   Functional Squats Other (comment)   2x8 to table   Other Standing Lumbar Exercises Pallof press walkout with 7# cable 2x8 BIL      Lumbar Exercises: Supine   Dead Bug Other (comment)   3x10 with green physioball   Other Supine Lumbar Exercises Curl up's with heel tap 3x10      Lumbar Exercises: Prone   Other Prone Lumbar Exercises Swimmers 2x16      Modalities   Modalities Moist Heat      Moist Heat Therapy   Number Minutes Moist Heat 10 Minutes    Moist Heat Location Lumbar Spine      Manual Therapy  Manual Therapy Soft tissue mobilization    Soft tissue mobilization Deep tissue massage to R lumbar paraspinals/ QL                  Upper Extremity Functional Index Score :   /80   PT Education - 12/06/20 1157     Education Details Proper form with new exercises    Person(s) Educated Patient    Methods Explanation;Demonstration;Handout    Comprehension Verbalized understanding;Returned demonstration              PT Short Term Goals - 11/22/20 1444       PT SHORT TERM GOAL #1   Title Pt will report understanding and adherence to his HEP in order to promote independence in the management of his primary impairments.    Baseline HEP given at baseline    Time 4    Period Weeks    Status New    Target Date 12/20/20               PT Long Term Goals - 11/22/20 1604       PT LONG TERM GOAL #1   Title The pt will demonstrate WNL trunk rotation AROM BIL in order to perform yardwork without limitation.    Baseline 60% to L, 80% to R    Time 4    Period Weeks    Status New    Target Date 01/17/21      PT LONG TERM GOAL #2   Title Pt will report ability to  stand >30 minutes with 0-2/10 pain in order to grocery shop without limitation.    Baseline Pt experiences >4/10 pain with standing >15-20 minutes    Time 8    Period Weeks    Status New    Target Date 01/17/21      PT LONG TERM GOAL #3   Title Pt will achieve a FOTO score of 66% in order to demonstrate improved functional ability as it relates to his lumbar impairments.    Baseline 59%    Time 8    Period Weeks    Status New    Target Date 01/17/21      PT LONG TERM GOAL #4   Title Pt will demonstrate BIL hip flexion, extension, and abduction MMT of 5/5 in order to return to independent exercise program without limitation.    Baseline 4+/5 with each MMT    Time 8    Period Weeks    Status New    Target Date 01/17/21                   Plan - 12/06/20 1158     Clinical Impression Statement Pt responded well to all interventions today, demonstrating proper form with all selected exercises. Due to an acute lumbar muscle strain, he was mildy limited by pain today, although he was able to accomplish all exercises with no increase in baseline pain at the end of the session.  He will continue to benefit from skilled PT to address his primary impairments and return to his prior level of function without limitation.    Personal Factors and Comorbidities Comorbidity 1;Time since onset of injury/illness/exacerbation    Comorbidities HTN    Examination-Activity Limitations Stand;Stairs;Locomotion Level    Examination-Participation Restrictions Cleaning;Community Activity;Yard Work    Stability/Clinical Decision Making Stable/Uncomplicated    Clinical Decision Making Low    Rehab Potential Good    PT Frequency 1x / week  PT Duration 8 weeks    PT Treatment/Interventions Taping;Passive range of motion;Dry needling;Spinal Manipulations;Joint Manipulations;Manual techniques;Neuromuscular re-education;Therapeutic exercise;Therapeutic activities;Patient/family education;Stair  training;Gait training;Moist Heat;Traction;Biofeedback;Electrical Stimulation;Cryotherapy    PT Next Visit Plan Progress core/ hip exercises, plank testing / PLE to rule in lumbar instability    PT Home Exercise Plan [id number]    Consulted and Agree with Plan of Care Patient             Patient will benefit from skilled therapeutic intervention in order to improve the following deficits and impairments:  Decreased range of motion, Hypermobility, Pain, Impaired flexibility, Hypomobility, Decreased strength  Visit Diagnosis: Chronic bilateral low back pain, unspecified whether sciatica present  Pain in right hip  Muscle weakness (generalized)  Low back pain, unspecified back pain laterality, unspecified chronicity, unspecified whether sciatica present     Problem List Patient Active Problem List   Diagnosis Date Noted   BPH (benign prostatic hyperplasia) 06/29/2020   Essential hypertension 12/29/2019   Mixed hyperlipidemia 12/29/2019   Seasonal allergies 12/29/2019   Type 2 diabetes mellitus with diabetic neuropathy, without long-term current use of insulin (HCC) 12/29/2019   Morbid obesity (HCC) 12/29/2019    [name], PT, DPT 12/06/20 12:15 PM   [facility] [address] [place], [place], [address] Phone: [phone]   Fax:  [phone]  Name: Darren Fisher MRN: [id number] Date of Birth: 1945/04/08

## 2020-12-06 NOTE — Patient Instructions (Signed)
  [id number] 

## 2020-12-13 ENCOUNTER — Ambulatory Visit: Payer: Medicare Other

## 2020-12-13 ENCOUNTER — Other Ambulatory Visit: Payer: Self-pay

## 2020-12-13 DIAGNOSIS — M545 Low back pain, unspecified: Secondary | ICD-10-CM | POA: Diagnosis not present

## 2020-12-13 DIAGNOSIS — M25551 Pain in right hip: Secondary | ICD-10-CM

## 2020-12-13 DIAGNOSIS — G8929 Other chronic pain: Secondary | ICD-10-CM

## 2020-12-13 DIAGNOSIS — M6281 Muscle weakness (generalized): Secondary | ICD-10-CM

## 2020-12-13 NOTE — Therapy (Signed)
[facility] [address] [place], [place], [address] Phone: [phone]   Fax:  [phone]  Physical Therapy Treatment  Patient Details  Name: Darren Fisher MRN: [id number] Date of Birth: 1945-06-07 Referring Provider (PT): Sharon Seller   Encounter Date: 12/13/2020   PT End of Session - 12/13/20 1146     Visit Number 4    Number of Visits 9    Date for PT Re-Evaluation 01/17/21    Authorization Type MCR/ BCBS    Authorization Time Period FOTO v6, v10    Progress Note Due on Visit 10    PT Start Time 1125    PT Stop Time 1210    PT Time Calculation (min) 45 min    Activity Tolerance Patient tolerated treatment well    Behavior During Therapy [facility] for tasks assessed/performed             Past Medical History:  Diagnosis Date   History of colonoscopy    [facility] [place] [facility]   History of MRI 04/01/2017   Hypertension    Wears glasses     Past Surgical History:  Procedure Laterality Date   INGUINAL HERNIA REPAIR Right 04/01/1965   Dr Nelma Rothman. [place] per [facility] new patient packet    INGUINAL HERNIA REPAIR Left 04/02/1991   [facility], Per [facility] New Patient Packet     There were no vitals filed for this visit.   Subjective Assessment - 12/13/20 1124     Subjective Pt reports low-level soreness in his low back today.  He reports doing his HEP daily.    Currently in Pain? Yes    Pain Score 3     Pain Location Back    Pain Orientation Lower    Pain Descriptors / Indicators Aching    Pain Type Acute pain                               [facility] Adult PT Treatment/Exercise - 12/13/20 0001       Lumbar Exercises: Machines for Strengthening   Other Lumbar Machine Exercise Standing trunk rotation at cable column 7#, 3x8 BIL    Other Lumbar Machine Exercise Standing abdominal pressdown with 20# cable 3x12      Lumbar Exercises: Standing   Other Standing Lumbar  Exercises Repeated trunk extension 3x10 in-between trunk rotation sets    Other Standing Lumbar Exercises deadlift with bar 3x8      Knee/Hip Exercises: Machines for Strengthening   Hip Cybex Abduction and extension 2x10 BIL with 25#                       PT Short Term Goals - 12/13/20 1150       PT SHORT TERM GOAL #1   Title Pt will report understanding and adherence to his HEP in order to promote independence in the management of his primary impairments.    Baseline Pt reports adherence to his HEP.    Time 4    Period Weeks    Status Achieved    Target Date 12/20/20               PT Long Term Goals - 11/22/20 1604       PT LONG TERM GOAL #1   Title The pt will demonstrate WNL trunk rotation AROM BIL in order to perform yardwork without limitation.    Baseline 60%  to L, 80% to R    Time 4    Period Weeks    Status New    Target Date 01/17/21      PT LONG TERM GOAL #2   Title Pt will report ability to stand >30 minutes with 0-2/10 pain in order to grocery shop without limitation.    Baseline Pt experiences >4/10 pain with standing >15-20 minutes    Time 8    Period Weeks    Status New    Target Date 01/17/21      PT LONG TERM GOAL #3   Title Pt will achieve a FOTO score of 66% in order to demonstrate improved functional ability as it relates to his lumbar impairments.    Baseline 59%    Time 8    Period Weeks    Status New    Target Date 01/17/21      PT LONG TERM GOAL #4   Title Pt will demonstrate BIL hip flexion, extension, and abduction MMT of 5/5 in order to return to independent exercise program without limitation.    Baseline 4+/5 with each MMT    Time 8    Period Weeks    Status New    Target Date 01/17/21                   Plan - 12/13/20 1148     Clinical Impression Statement Pt responded well to all interventions today, demonstrating proper form and no increase in pain with all selected exercises. He reports that he  really enjoys the abdominal pressdown exercise. He will continue to benefit from skilled PT to address his primary impairments and return to his prior level of function without limitation.    Personal Factors and Comorbidities Comorbidity 1;Time since onset of injury/illness/exacerbation    Comorbidities HTN    Examination-Activity Limitations Stand;Stairs;Locomotion Level    Examination-Participation Restrictions Cleaning;Community Activity;Yard Work    Stability/Clinical Decision Making Stable/Uncomplicated    Optometrist Low    Rehab Potential Good    PT Frequency 1x / week    PT Duration 8 weeks    PT Treatment/Interventions Taping;Passive range of motion;Dry needling;Spinal Manipulations;Joint Manipulations;Manual techniques;Neuromuscular re-education;Therapeutic exercise;Therapeutic activities;Patient/family education;Stair training;Gait training;Moist Heat;Traction;Biofeedback;Electrical Stimulation;Cryotherapy    PT Next Visit Plan Progress core/ hip exercises, plank testing / PLE to rule in lumbar instability    PT Home Exercise Plan [id number]    Consulted and Agree with Plan of Care Patient             Patient will benefit from skilled therapeutic intervention in order to improve the following deficits and impairments:  Decreased range of motion, Hypermobility, Pain, Impaired flexibility, Hypomobility, Decreased strength  Visit Diagnosis: Chronic bilateral low back pain, unspecified whether sciatica present  Pain in right hip  Muscle weakness (generalized)  Low back pain, unspecified back pain laterality, unspecified chronicity, unspecified whether sciatica present     Problem List Patient Active Problem List   Diagnosis Date Noted   BPH (benign prostatic hyperplasia) 06/29/2020   Essential hypertension 12/29/2019   Mixed hyperlipidemia 12/29/2019   Seasonal allergies 12/29/2019   Type 2 diabetes mellitus with diabetic neuropathy, without long-term  current use of insulin (HCC) 12/29/2019   Morbid obesity (HCC) 12/29/2019    [name], PT, DPT 12/13/20 12:10 PM   [facility] [address] [place], [place], [address] Phone: [phone]   Fax:  [phone]  Name: Darren Fisher MRN: [id number] Date  of Birth: Sep 04, 1945

## 2020-12-19 ENCOUNTER — Other Ambulatory Visit: Payer: Self-pay | Admitting: Nurse Practitioner

## 2020-12-19 NOTE — Telephone Encounter (Signed)
He started [place] right before he moved.  Has always had sinus issues, congestion.  Always gets better when he comes down to [place] but this time it was worse. He noticed sinus issues were worse.  He stopped taking the 100 mg and started taking the 50 mg and feels like it has gotten better.  He reports he was not aware he had diabetes. Reports he can do better with his diet.

## 2020-12-20 ENCOUNTER — Ambulatory Visit: Payer: Medicare Other

## 2020-12-20 ENCOUNTER — Other Ambulatory Visit: Payer: Self-pay

## 2020-12-20 DIAGNOSIS — M25551 Pain in right hip: Secondary | ICD-10-CM

## 2020-12-20 DIAGNOSIS — M545 Low back pain, unspecified: Secondary | ICD-10-CM

## 2020-12-20 DIAGNOSIS — G8929 Other chronic pain: Secondary | ICD-10-CM

## 2020-12-20 DIAGNOSIS — M6281 Muscle weakness (generalized): Secondary | ICD-10-CM

## 2020-12-20 NOTE — Patient Instructions (Signed)
  [id number] 

## 2020-12-20 NOTE — Therapy (Signed)
[facility] Outpatient Rehabilitation [facility] [address] [place], [place], [address] Phone: [phone]   Fax:  [phone]  Physical Therapy Treatment  Patient Details  Name: Darren Fisher MRN: [id number] Date of Birth: Jul 06, 1945 Referring Provider (PT): Sharon Seller   Encounter Date: 12/20/2020   PT End of Session - 12/20/20 1229     Visit Number 5    Number of Visits 9    Date for PT Re-Evaluation 01/17/21    Authorization Type MCR/ BCBS    Authorization Time Period FOTO v6, v10    Progress Note Due on Visit 10    PT Start Time 1215    PT Stop Time 1255    PT Time Calculation (min) 40 min    Activity Tolerance Patient tolerated treatment well    Behavior During Therapy [facility] for tasks assessed/performed             Past Medical History:  Diagnosis Date   History of colonoscopy    [facility] [place] [facility]   History of MRI 04/01/2017   Hypertension    Wears glasses     Past Surgical History:  Procedure Laterality Date   INGUINAL HERNIA REPAIR Right 04/01/1965   Dr Nelma Rothman. [place] per [facility] new patient packet    INGUINAL HERNIA REPAIR Left 04/02/1991   [facility], Per [facility] New Patient Packet     There were no vitals filed for this visit.   Subjective Assessment - 12/20/20 1213     Subjective Pt reports continued soreness in her low back, although he states he feels much stronger since starting PT. He adds that he has been performing his HEP regularly, although he may miss a day here or there.    Currently in Pain? Yes    Pain Score 4     Pain Location Back    Pain Orientation Lower    Pain Descriptors / Indicators Aching    Pain Type Acute pain;Chronic pain    Pain Onset More than a month ago    Pain Frequency Constant                               [facility] Adult PT Treatment/Exercise - 12/20/20 0001       Lumbar Exercises: Stretches   Lower Trunk Rotation Limitations 2x10 BIL     Other Lumbar Stretch Exercise Cobra stretch x10 with 3sec hold      Lumbar Exercises: Machines for Strengthening   Other Lumbar Machine Exercise Standing abdominal pressdown with 20# cable 3x12      Lumbar Exercises: Standing   Other Standing Lumbar Exercises deadlift with barbell and 30# 3x8      Lumbar Exercises: Prone   Other Prone Lumbar Exercises Standard plank 3x to exhaustion    Other Prone Lumbar Exercises Bird dog knee taps 2x10 BIL      Knee/Hip Exercises: Machines for Strengthening   Hip Cybex Abduction and extension 2x10 BIL with 37.5#                     PT Education - 12/20/20 1229     Education Details Updated HEP    Person(s) Educated Patient    Methods Explanation;Demonstration;Handout    Comprehension Verbalized understanding;Returned demonstration              PT Short Term Goals - 12/13/20 1150       PT SHORT TERM GOAL #  1   Title Pt will report understanding and adherence to his HEP in order to promote independence in the management of his primary impairments.    Baseline Pt reports adherence to his HEP.    Time 4    Period Weeks    Status Achieved    Target Date 12/20/20               PT Long Term Goals - 11/22/20 1604       PT LONG TERM GOAL #1   Title The pt will demonstrate WNL trunk rotation AROM BIL in order to perform yardwork without limitation.    Baseline 60% to L, 80% to R    Time 4    Period Weeks    Status New    Target Date 01/17/21      PT LONG TERM GOAL #2   Title Pt will report ability to stand >30 minutes with 0-2/10 pain in order to grocery shop without limitation.    Baseline Pt experiences >4/10 pain with standing >15-20 minutes    Time 8    Period Weeks    Status New    Target Date 01/17/21      PT LONG TERM GOAL #3   Title Pt will achieve a FOTO score of 66% in order to demonstrate improved functional ability as it relates to his lumbar impairments.    Baseline 59%    Time 8    Period Weeks     Status New    Target Date 01/17/21      PT LONG TERM GOAL #4   Title Pt will demonstrate BIL hip flexion, extension, and abduction MMT of 5/5 in order to return to independent exercise program without limitation.    Baseline 4+/5 with each MMT    Time 8    Period Weeks    Status New    Target Date 01/17/21                   Plan - 12/20/20 1230     Clinical Impression Statement Pt responded well to all interventions today, demonstrating proper form and no increase in pain with all selected exercises. He also demonstrates improved functional strength as indicated by his ability to perform previous exercises with increased load without limitation. He will continue to benefit from skilled PT to address his primary impairments and return to his prior level of function without limitation.    Personal Factors and Comorbidities Comorbidity 1;Time since onset of injury/illness/exacerbation    Comorbidities HTN    Examination-Activity Limitations Stand;Stairs;Locomotion Level    Examination-Participation Restrictions Cleaning;Community Activity;Yard Work    Stability/Clinical Decision Making Stable/Uncomplicated    Optometrist Low    Rehab Potential Good    PT Frequency 1x / week    PT Duration 8 weeks    PT Treatment/Interventions Taping;Passive range of motion;Dry needling;Spinal Manipulations;Joint Manipulations;Manual techniques;Neuromuscular re-education;Therapeutic exercise;Therapeutic activities;Patient/family education;Stair training;Gait training;Moist Heat;Traction;Biofeedback;Electrical Stimulation;Cryotherapy    PT Next Visit Plan Progress core/ hip exercises, plank testing / PLE to rule in lumbar instability    PT Home Exercise Plan [id number]    Consulted and Agree with Plan of Care Patient             Patient will benefit from skilled therapeutic intervention in order to improve the following deficits and impairments:  Decreased range of motion,  Hypermobility, Pain, Impaired flexibility, Hypomobility, Decreased strength  Visit Diagnosis: Chronic bilateral low back pain, unspecified whether sciatica present  Pain in right hip  Muscle weakness (generalized)  Low back pain, unspecified back pain laterality, unspecified chronicity, unspecified whether sciatica present     Problem List Patient Active Problem List   Diagnosis Date Noted   BPH (benign prostatic hyperplasia) 06/29/2020   Essential hypertension 12/29/2019   Mixed hyperlipidemia 12/29/2019   Seasonal allergies 12/29/2019   Type 2 diabetes mellitus with diabetic neuropathy, without long-term current use of insulin (HCC) 12/29/2019   Morbid obesity (HCC) 12/29/2019    [name], PT, DPT 12/20/20 12:54 PM   [facility] [address] [place], [place], [address] Phone: [phone]   Fax:  [phone]  Name: Darren Fisher MRN: [id number] Date of Birth: Nov 25, 1945

## 2020-12-29 ENCOUNTER — Other Ambulatory Visit: Payer: Self-pay

## 2020-12-29 ENCOUNTER — Ambulatory Visit: Payer: Medicare Other

## 2020-12-29 DIAGNOSIS — G8929 Other chronic pain: Secondary | ICD-10-CM

## 2020-12-29 DIAGNOSIS — M545 Low back pain, unspecified: Secondary | ICD-10-CM | POA: Diagnosis not present

## 2020-12-29 DIAGNOSIS — M25551 Pain in right hip: Secondary | ICD-10-CM

## 2020-12-29 DIAGNOSIS — M6281 Muscle weakness (generalized): Secondary | ICD-10-CM

## 2020-12-29 NOTE — Therapy (Signed)
[facility] Outpatient Rehabilitation [facility] [address] [place], [place], [address] Phone: [phone]   Fax:  [phone]  Physical Therapy Treatment  Patient Details  Name: Darren Fisher MRN: [id number] Date of Birth: 1946-03-06 Referring Provider (PT): Sharon Seller   Encounter Date: 12/29/2020   PT End of Session - 12/29/20 0855     Visit Number 6    Number of Visits 9    Date for PT Re-Evaluation 01/17/21    Authorization Type MCR/ BCBS    Authorization Time Period FOTO v6, v10    Progress Note Due on Visit 10    PT Start Time 0845   pt arrived 15 minutes late to his appointment   PT Stop Time 0915    PT Time Calculation (min) 30 min    Activity Tolerance Patient tolerated treatment well    Behavior During Therapy [facility] for tasks assessed/performed             Past Medical History:  Diagnosis Date   History of colonoscopy    [facility] [place] [facility]   History of MRI 04/01/2017   Hypertension    Wears glasses     Past Surgical History:  Procedure Laterality Date   INGUINAL HERNIA REPAIR Right 04/01/1965   Dr Nelma Rothman. [place] per [facility] new patient packet    INGUINAL HERNIA REPAIR Left 04/02/1991   [facility], Per [facility] New Patient Packet     There were no vitals filed for this visit.   Subjective Assessment - 12/29/20 0844     Subjective Pt reports that he has had sinus issues recently, which led him to being late to his appointment today. He states he is supposed to see ENT to address this problem. He reports not doing his HEP regularly this week due to not feeling well.    How long can you sit comfortably? Unlimited    How long can you stand comfortably? 30 minutes or longer    How long can you walk comfortably? Unlimited    Diagnostic tests Pt reports having a lumbar MRI around 2015, imaging unavilable    Patient Stated Goals Return to exercise, household activities    Currently in Pain? No/denies    Pain  Score 0-No pain                [facility] PT Assessment - 12/29/20 0001       Observation/Other Assessments   Focus on Therapeutic Outcomes (FOTO)  56%      AROM   Lumbar - Right Rotation 100%    Lumbar - Left Rotation 100%                           [facility] Adult PT Treatment/Exercise - 12/29/20 0001       Lumbar Exercises: Machines for Strengthening   Other Lumbar Machine Exercise Standing trunk rotation at cable column 10#, 2x8 BIL      Lumbar Exercises: Prone   Other Prone Lumbar Exercises Standard plank 3x to exhaustion      Knee/Hip Exercises: Machines for Strengthening   Hip Cybex Abduction and extension 2x10 BIL with 42.5#                     PT Education - 12/29/20 0900     Education Details Discussed objective measures assessed in today's re-assessment, noting areas of pt progress.    Person(s) Educated Patient    Methods Explanation  Comprehension Verbalized understanding              PT Short Term Goals - 12/13/20 1150       PT SHORT TERM GOAL #1   Title Pt will report understanding and adherence to his HEP in order to promote independence in the management of his primary impairments.    Baseline Pt reports adherence to his HEP.    Time 4    Period Weeks    Status Achieved    Target Date 12/20/20               PT Long Term Goals - 12/29/20 6063       PT LONG TERM GOAL #1   Title The pt will demonstrate WNL trunk rotation AROM BIL in order to perform yardwork without limitation.    Baseline WNL BIL    Time 4    Period Weeks    Status Achieved      PT LONG TERM GOAL #2   Title Pt will report ability to stand >30 minutes with 0-2/10 pain in order to grocery shop without limitation.    Baseline Pt experiences >4/10 pain with standing >15-20 minutes; Pt reports being able to stand longer than 30 minutes without pain (12/29/2020)    Time 8    Period Weeks    Status Achieved      PT LONG TERM GOAL #3   Title  Pt will achieve a FOTO score of 66% in order to demonstrate improved functional ability as it relates to his lumbar impairments.    Baseline 59%; 56% (12/29/2020)    Time 8    Period Weeks    Status On-going      PT LONG TERM GOAL #4   Title Pt will demonstrate BIL hip flexion, extension, and abduction MMT of 5/5 in order to return to independent exercise program without limitation.    Baseline 4+/5 with each MMT    Time 8    Period Weeks    Status On-going                   Plan - 12/29/20 0856     Clinical Impression Statement Pt arrived 15 minutes to his appointment today, which led to a truncated treatment session. Upon re-assessment of his rehab goals, he has made good improvement in lumbar rotation AROM and functional ability to stand for prolonged periods. His FOTO score regressed slightly, however, the pt reports noticing a pattern of progress since starting PT. He will continue to benefit from skilled PT to address his primary impairments and return to his prior level of function without limitation.    Personal Factors and Comorbidities Comorbidity 1;Time since onset of injury/illness/exacerbation    Comorbidities HTN    Examination-Activity Limitations Stand;Stairs;Locomotion Level    Examination-Participation Restrictions Cleaning;Community Activity;Yard Work    Stability/Clinical Decision Making Stable/Uncomplicated    Optometrist Low    Rehab Potential Good    PT Frequency 1x / week    PT Duration 8 weeks    PT Treatment/Interventions Taping;Passive range of motion;Dry needling;Spinal Manipulations;Joint Manipulations;Manual techniques;Neuromuscular re-education;Therapeutic exercise;Therapeutic activities;Patient/family education;Stair training;Gait training;Moist Heat;Traction;Biofeedback;Electrical Stimulation;Cryotherapy    PT Next Visit Plan Progress core/ hip exercises    PT Home Exercise Plan [id number]    Consulted and Agree with Plan of Care  Patient             Patient will benefit from skilled therapeutic intervention in order to improve the following  deficits and impairments:  Decreased range of motion, Hypermobility, Pain, Impaired flexibility, Hypomobility, Decreased strength  Visit Diagnosis: Chronic bilateral low back pain, unspecified whether sciatica present  Pain in right hip  Muscle weakness (generalized)  Low back pain, unspecified back pain laterality, unspecified chronicity, unspecified whether sciatica present     Problem List Patient Active Problem List   Diagnosis Date Noted   BPH (benign prostatic hyperplasia) 06/29/2020   Essential hypertension 12/29/2019   Mixed hyperlipidemia 12/29/2019   Seasonal allergies 12/29/2019   Type 2 diabetes mellitus with diabetic neuropathy, without long-term current use of insulin (HCC) 12/29/2019   Morbid obesity (HCC) 12/29/2019    [name], PT, DPT 12/29/20 9:15 AM   [facility] [address] [place], [place], [address] Phone: [phone]   Fax:  [phone]  Name: Darren Fisher MRN: [id number] Date of Birth: [birth year]

## 2021-01-03 ENCOUNTER — Other Ambulatory Visit: Payer: Self-pay

## 2021-01-03 ENCOUNTER — Ambulatory Visit: Payer: Medicare Other | Attending: Nurse Practitioner

## 2021-01-03 DIAGNOSIS — M6281 Muscle weakness (generalized): Secondary | ICD-10-CM | POA: Diagnosis present

## 2021-01-03 DIAGNOSIS — M25551 Pain in right hip: Secondary | ICD-10-CM | POA: Insufficient documentation

## 2021-01-03 DIAGNOSIS — G8929 Other chronic pain: Secondary | ICD-10-CM | POA: Insufficient documentation

## 2021-01-03 DIAGNOSIS — M545 Low back pain, unspecified: Secondary | ICD-10-CM | POA: Diagnosis not present

## 2021-01-03 NOTE — Therapy (Signed)
[facility] [address] [place], [place], [address] Phone: [phone]   Fax:  [phone]  Physical Therapy Treatment  Patient Details  Name: Darren Fisher MRN: [id number] Date of Birth: Feb 05, 1946 Referring Provider (PT): Sharon Seller   Encounter Date: 01/03/2021   PT End of Session - 01/03/21 1227     Visit Number 7    Number of Visits 9    Date for PT Re-Evaluation 01/17/21    Authorization Type MCR/ BCBS    Authorization Time Period FOTO v6, v10    Progress Note Due on Visit 10    PT Start Time 1215    PT Stop Time 1300    PT Time Calculation (min) 45 min    Activity Tolerance Patient tolerated treatment well    Behavior During Therapy [facility] for tasks assessed/performed             Past Medical History:  Diagnosis Date   History of colonoscopy    [facility] [place] [facility]   History of MRI 04/01/2017   Hypertension    Wears glasses     Past Surgical History:  Procedure Laterality Date   INGUINAL HERNIA REPAIR Right 04/01/1965   Dr Nelma Rothman. [place] per [facility] new patient packet    INGUINAL HERNIA REPAIR Left 04/02/1991   [facility], Per [facility] New Patient Packet     There were no vitals filed for this visit.   Subjective Assessment - 01/03/21 1216     Subjective Pt reports no pain today, adding that he has been doing his exercises daily.    Currently in Pain? No/denies    Pain Score 0-No pain                               [facility] Adult PT Treatment/Exercise - 01/03/21 0001       Lumbar Exercises: Standing   Other Standing Lumbar Exercises Downward chops at cable column with 20# cable 3x8 BIL      Lumbar Exercises: Supine   Other Supine Lumbar Exercises bicycles 3x30 with YTB      Lumbar Exercises: Sidelying   Other Sidelying Lumbar Exercises Lumbar side bend with hips on Bosu ball 2x10 BIL      Lumbar Exercises: Prone   Other Prone Lumbar Exercises  lumbar extension with hips on bosu ball 2x10    Other Prone Lumbar Exercises --      Knee/Hip Exercises: Machines for Strengthening   Cybex Leg Press 3x8 with 100#    Hip Cybex Abduction and extension 2x10 BIL with 42.5#                       PT Short Term Goals - 12/13/20 1150       PT SHORT TERM GOAL #1   Title Pt will report understanding and adherence to his HEP in order to promote independence in the management of his primary impairments.    Baseline Pt reports adherence to his HEP.    Time 4    Period Weeks    Status Achieved    Target Date 12/20/20               PT Long Term Goals - 12/29/20 3235       PT LONG TERM GOAL #1   Title The pt will demonstrate WNL trunk rotation AROM BIL in order to perform yardwork without limitation.  Baseline WNL BIL    Time 4    Period Weeks    Status Achieved      PT LONG TERM GOAL #2   Title Pt will report ability to stand >30 minutes with 0-2/10 pain in order to grocery shop without limitation.    Baseline Pt experiences >4/10 pain with standing >15-20 minutes; Pt reports being able to stand longer than 30 minutes without pain (12/29/2020)    Time 8    Period Weeks    Status Achieved      PT LONG TERM GOAL #3   Title Pt will achieve a FOTO score of 66% in order to demonstrate improved functional ability as it relates to his lumbar impairments.    Baseline 59%; 56% (12/29/2020)    Time 8    Period Weeks    Status On-going      PT LONG TERM GOAL #4   Title Pt will demonstrate BIL hip flexion, extension, and abduction MMT of 5/5 in order to return to independent exercise program without limitation.    Baseline 4+/5 with each MMT    Time 8    Period Weeks    Status On-going                   Plan - 01/03/21 1227     Clinical Impression Statement Pt responded well to all interventions today, including newly added interventions with proper form and no increase in pain. He will continue to benefit  from skilled PT to address his primary impairments and return to his prior level of function without limitation.    Personal Factors and Comorbidities Comorbidity 1;Time since onset of injury/illness/exacerbation    Comorbidities HTN    Examination-Activity Limitations Stand;Stairs;Locomotion Level    Examination-Participation Restrictions Cleaning;Community Activity;Yard Work    Stability/Clinical Decision Making Stable/Uncomplicated    Optometrist Low    Rehab Potential Good    PT Frequency 1x / week    PT Duration 8 weeks    PT Treatment/Interventions Taping;Passive range of motion;Dry needling;Spinal Manipulations;Joint Manipulations;Manual techniques;Neuromuscular re-education;Therapeutic exercise;Therapeutic activities;Patient/family education;Stair training;Gait training;Moist Heat;Traction;Biofeedback;Electrical Stimulation;Cryotherapy    PT Next Visit Plan Progress core/ hip exercises    PT Home Exercise Plan [id number]    Consulted and Agree with Plan of Care Patient             Patient will benefit from skilled therapeutic intervention in order to improve the following deficits and impairments:  Decreased range of motion, Hypermobility, Pain, Impaired flexibility, Hypomobility, Decreased strength  Visit Diagnosis: Chronic bilateral low back pain, unspecified whether sciatica present  Pain in right hip  Muscle weakness (generalized)  Low back pain, unspecified back pain laterality, unspecified chronicity, unspecified whether sciatica present     Problem List Patient Active Problem List   Diagnosis Date Noted   BPH (benign prostatic hyperplasia) 06/29/2020   Essential hypertension 12/29/2019   Mixed hyperlipidemia 12/29/2019   Seasonal allergies 12/29/2019   Type 2 diabetes mellitus with diabetic neuropathy, without long-term current use of insulin (HCC) 12/29/2019   Morbid obesity (HCC) 12/29/2019    [name], PT, DPT 01/03/21 1:00  PM   [facility] [address] [place], [place], [address] Phone: [phone]   Fax:  [phone]  Name: Kristoph Sattler MRN: [id number] Date of Birth: November 07, 1945

## 2021-01-10 ENCOUNTER — Ambulatory Visit: Payer: Medicare Other

## 2021-01-17 ENCOUNTER — Ambulatory Visit: Payer: Medicare Other

## 2021-01-17 ENCOUNTER — Other Ambulatory Visit: Payer: Self-pay

## 2021-01-17 DIAGNOSIS — M545 Low back pain, unspecified: Secondary | ICD-10-CM | POA: Diagnosis not present

## 2021-01-17 DIAGNOSIS — G8929 Other chronic pain: Secondary | ICD-10-CM

## 2021-01-17 DIAGNOSIS — M25551 Pain in right hip: Secondary | ICD-10-CM

## 2021-01-17 DIAGNOSIS — M6281 Muscle weakness (generalized): Secondary | ICD-10-CM

## 2021-01-17 NOTE — Therapy (Signed)
[place], [place], [address] Phone: [phone]   Fax:  [phone]  Physical Therapy Treatment/ Discharge Summary  Patient Details  Name: Darren Fisher MRN: [id number] Date of Birth: 14-Jul-1945 Referring Provider (PT): Lauree Chandler   Encounter Date: 01/17/2021   PT End of Session - 01/17/21 1231     Visit Number 8    Number of Visits 9    Date for PT Re-Evaluation 01/17/21    Authorization Type MCR/ BCBS    Authorization Time Period FOTO v6, v10    Progress Note Due on Visit 10    PT Start Time 1215    PT Stop Time 1300    PT Time Calculation (min) 45 min    Activity Tolerance Patient tolerated treatment well    Behavior During Therapy [facility] for tasks assessed/performed             Past Medical History:  Diagnosis Date   History of colonoscopy    [facility]   History of MRI 04/01/2017   Hypertension    Wears glasses     Past Surgical History:  Procedure Laterality Date   INGUINAL HERNIA REPAIR Right 04/01/1965   Dr Vassie Moment. [place] per [facility] new patient packet    INGUINAL HERNIA REPAIR Left 04/02/1991   [facility], Per [facility] New Patient Packet     There were no vitals filed for this visit.   Subjective Assessment - 01/17/21 1210     Subjective Pt reports no pain today and reports that he feels independent in the management of his sxs. He reports feeling ready for discharge today.    How long can you sit comfortably? Unlimited    How long can you stand comfortably? Unlimited    How long can you walk comfortably? Unlimited    Diagnostic tests Pt reports having a lumbar MRI around 2015, imaging unavilable    Patient Stated Goals Return to exercise, household activities    Currently in Pain? No/denies    Pain Score 0-No pain    Pain Location Back                [facility] PT Assessment - 01/17/21 0001       Observation/Other Assessments    Focus on Therapeutic Outcomes (FOTO)  67%      Strength   Right Hip Flexion 5/5    Right Hip Extension 5/5    Right Hip External Rotation  5/5    Right Hip Internal Rotation 5/5    Right Hip ABduction 5/5    Right Hip ADduction 5/5    Left Hip Flexion 5/5    Left Hip Extension 5/5    Left Hip External Rotation 5/5    Left Hip Internal Rotation 5/5    Left Hip ABduction 5/5    Left Hip ADduction 5/5                           [facility] Adult PT Treatment/Exercise - 01/17/21 0001       Lumbar Exercises: Stretches   Press Ups 3 reps;30 seconds      Lumbar Exercises: Standing   Other Standing Lumbar Exercises Alternating diagonal abdominal pressdown with 20# cable 2x10 BIL    Other Standing Lumbar Exercises SL RDL with 10# KB 2x10 BIL      Lumbar Exercises: Sidelying   Other Sidelying Lumbar Exercises Lumbar side bend with hips  on Bosu ball 2x10 BIL      Lumbar Exercises: Prone   Other Prone Lumbar Exercises lumbar extension with hips on bosu ball 2x10    Other Prone Lumbar Exercises Standard plank 3x to exhaustion                     PT Education - 01/17/21 1231     Education Details Pt educated on objective progress made in PT, instructed to independently progress HEP following discharge    Person(s) Educated Patient    Methods Explanation    Comprehension Verbalized understanding              PT Short Term Goals - 12/13/20 1150       PT SHORT TERM GOAL #1   Title Pt will report understanding and adherence to his HEP in order to promote independence in the management of his primary impairments.    Baseline Pt reports adherence to his HEP.    Time 4    Period Weeks    Status Achieved    Target Date 12/20/20               PT Long Term Goals - 01/17/21 1227       PT LONG TERM GOAL #1   Title The pt will demonstrate WNL trunk rotation AROM BIL in order to perform yardwork without limitation.    Baseline WNL BIL    Time 4     Period Weeks    Status Achieved      PT LONG TERM GOAL #2   Title Pt will report ability to stand >30 minutes with 0-2/10 pain in order to grocery shop without limitation.    Baseline Pt experiences >4/10 pain with standing >15-20 minutes; Pt reports being able to stand longer than 30 minutes without pain (12/29/2020)    Time 8    Period Weeks    Status Achieved      PT LONG TERM GOAL #3   Title Pt will achieve a FOTO score of 66% in order to demonstrate improved functional ability as it relates to his lumbar impairments.    Baseline 59%; 56% (12/29/2020); 67% (01/17/2021)    Time 8    Period Weeks    Status Achieved      PT LONG TERM GOAL #4   Title Pt will demonstrate BIL hip flexion, extension, and abduction MMT of 5/5 in order to return to independent exercise program without limitation.    Baseline 4+/5 with each MMT; 5/5 global BIL hip strength (01/17/2021)    Time 8    Period Weeks    Status Achieved                   Plan - 01/17/21 1233     Clinical Impression Statement Upon reassessment of objective measures, the pt has made excellent progress in functional standing ability, FOTO score, and global hip strength. He has met all of his stated rehab goals and expresses that he feels independent enough in his HEP progression to be discharged from PT at this time. Due to these factors the pt is discharged. During the treatment portion of today's session, the pt responded well to all newly introduced exercises, demonstrating good form and no pain with all interventions.    PT Next Visit Plan Pt is discharged from PT at this time.    PT Home Exercise Plan [id number]    Consulted and Agree with Plan of Care Patient  Patient will benefit from skilled therapeutic intervention in order to improve the following deficits and impairments:     Visit Diagnosis: Chronic bilateral low back pain, unspecified whether sciatica present  Pain in right hip  Muscle  weakness (generalized)  Low back pain, unspecified back pain laterality, unspecified chronicity, unspecified whether sciatica present     Problem List Patient Active Problem List   Diagnosis Date Noted   BPH (benign prostatic hyperplasia) 06/29/2020   Essential hypertension 12/29/2019   Mixed hyperlipidemia 12/29/2019   Seasonal allergies 12/29/2019   Type 2 diabetes mellitus with diabetic neuropathy, without long-term current use of insulin ([place]) 12/29/2019   Morbid obesity [facility]) 12/29/2019     [facility] [place], [place], [address] Phone: [phone]   Fax:  [phone]  Name: Darren Fisher MRN: [id number] Date of Birth: [birth year]  PHYSICAL THERAPY DISCHARGE SUMMARY  Visits from Start of Care: 8  Current functional level related to goals / functional outcomes: Pt has met all of his functional rehab goals   Remaining deficits: None assessed   Education / Equipment: HEP   Patient agrees to discharge. Patient goals were met. Patient is being discharged due to meeting the stated rehab goals.  Vanessa Linden, PT, DPT 01/17/21 12:58 PM

## 2021-01-18 ENCOUNTER — Other Ambulatory Visit: Payer: Self-pay | Admitting: Urology

## 2021-01-18 DIAGNOSIS — R972 Elevated prostate specific antigen [PSA]: Secondary | ICD-10-CM

## 2021-02-04 ENCOUNTER — Other Ambulatory Visit: Payer: Self-pay | Admitting: Nurse Practitioner

## 2021-02-06 ENCOUNTER — Ambulatory Visit
Admission: RE | Admit: 2021-02-06 | Discharge: 2021-02-06 | Disposition: A | Discharge status: Home / Self Care | Payer: Medicare Other | Source: Ambulatory Visit | Attending: Urology | Admitting: Urology

## 2021-02-06 ENCOUNTER — Other Ambulatory Visit: Payer: Self-pay

## 2021-02-06 DIAGNOSIS — R972 Elevated prostate specific antigen [PSA]: Secondary | ICD-10-CM

## 2021-02-06 MED ORDER — GADOBENATE DIMEGLUMINE 529 MG/ML IV SOLN
20.0000 mL | Freq: Once | INTRAVENOUS | Status: AC | PRN
  Administered 2021-02-06: 20 mL via INTRAVENOUS

## 2021-02-19 ENCOUNTER — Ambulatory Visit (INDEPENDENT_AMBULATORY_CARE_PROVIDER_SITE_OTHER): Payer: Medicare Other | Admitting: Nurse Practitioner

## 2021-02-19 ENCOUNTER — Other Ambulatory Visit: Payer: Self-pay

## 2021-02-19 ENCOUNTER — Encounter: Payer: Self-pay | Admitting: Nurse Practitioner

## 2021-02-19 VITALS — BP 138/90 | HR 96 | Temp 97.5°F | Ht 72.0 in | Wt 291.0 lb

## 2021-02-19 DIAGNOSIS — N183 Chronic kidney disease, stage 3 unspecified: Secondary | ICD-10-CM

## 2021-02-19 DIAGNOSIS — I10 Essential (primary) hypertension: Secondary | ICD-10-CM

## 2021-02-19 DIAGNOSIS — E1140 Type 2 diabetes mellitus with diabetic neuropathy, unspecified: Secondary | ICD-10-CM

## 2021-02-19 DIAGNOSIS — Z23 Encounter for immunization: Secondary | ICD-10-CM | POA: Diagnosis not present

## 2021-02-19 DIAGNOSIS — M5136 Other intervertebral disc degeneration, lumbar region: Secondary | ICD-10-CM

## 2021-02-19 DIAGNOSIS — R972 Elevated prostate specific antigen [PSA]: Secondary | ICD-10-CM

## 2021-02-19 DIAGNOSIS — J302 Other seasonal allergic rhinitis: Secondary | ICD-10-CM

## 2021-02-19 DIAGNOSIS — E1122 Type 2 diabetes mellitus with diabetic chronic kidney disease: Secondary | ICD-10-CM

## 2021-02-19 DIAGNOSIS — N401 Enlarged prostate with lower urinary tract symptoms: Secondary | ICD-10-CM

## 2021-02-19 DIAGNOSIS — R351 Nocturia: Secondary | ICD-10-CM

## 2021-02-19 DIAGNOSIS — E782 Mixed hyperlipidemia: Secondary | ICD-10-CM

## 2021-02-19 NOTE — Patient Instructions (Signed)
To stop benadryl  Start zyrtec 10 mg daily my mouth daily

## 2021-02-19 NOTE — Progress Notes (Signed)
Careteam: Patient Care Team: Lauree Chandler, NP as PCP - General (Geriatric Medicine)  PLACE OF SERVICE:  [place] Directive information Does Patient Have a Medical Advance Directive?: No, Would patient like information on creating a medical advance directive?: Yes (MAU/Ambulatory/Procedural Areas - Information given)  No Known Allergies  Chief Complaint  Patient presents with   Medical Management of Chronic Issues    3 month follow-up. Discuss need for Tdap, shingrix, covid #4, and eye exam or postpone/exclude if patient refuses. Discuss advance care planning. Flu vaccine today.      HPI: Patient is a 75 y.o. male for routine follow up.   Completed PT and it helped. His back and health felt better.  He was given exercises to continue at home but not doing as regularly.   Htn- continues on norvasc and losartan  DM- a1c elevated on last labs, he is taking [place] and farxia   GERD- uses prilosec PRN  Hyperlipidemia- on crestor, LDL at goal.   BPH- on flomax, recent MRI which showed enlarged prostate Has appt December for follow up  Flow remains stable but getting up at least twice at night.   Review of Systems:  Review of Systems  Constitutional:  Negative for chills, fever and weight loss.  HENT:  Negative for tinnitus.   Respiratory:  Negative for cough, sputum production and shortness of breath.   Cardiovascular:  Negative for chest pain, palpitations and leg swelling.  Gastrointestinal:  Negative for abdominal pain, constipation, diarrhea and heartburn.  Genitourinary:  Negative for dysuria, frequency and urgency.  Musculoskeletal:  Positive for back pain. Negative for falls, joint pain and myalgias.  Skin: Negative.   Neurological:  Negative for dizziness and headaches.  Psychiatric/Behavioral:  Negative for depression and memory loss. The patient does not have insomnia.    Past Medical History:  Diagnosis Date   History of colonoscopy     [facility]   History of MRI 04/01/2017   Hypertension    Wears glasses    Past Surgical History:  Procedure Laterality Date   INGUINAL HERNIA REPAIR Right 04/01/1965   Dr Vassie Moment. [place] per [facility] new patient packet    INGUINAL HERNIA REPAIR Left 04/02/1991   [facility], Per [facility] New Patient Packet    Social History:   reports that he has never smoked. He has never used smokeless tobacco. He reports that he does not currently use alcohol. He reports that he does not use drugs.  Family History  Problem Relation Age of Onset   Asthma Father    Other Sister    Diabetes Brother    Congestive Heart Failure Brother    Stomach cancer Sister    Obesity Brother     Medications: Patient's Medications  New Prescriptions   No medications on file  Previous Medications   AMLODIPINE (NORVASC) 10 MG TABLET    TAKE 1 TABLET(10 MG) BY MOUTH DAILY   CYANOCOBALAMIN (B-12 PO)    Take 1 tablet by mouth daily.   DAPAGLIFLOZIN PROPANEDIOL (FARXIGA) 5 MG TABS TABLET    Take 1 tablet (5 mg total) by mouth daily before breakfast.   DIPHENHYDRAMINE (BENADRYL) 25 MG TABLET    Take 25 mg by mouth as needed.   ELDERBERRY PO    Take 50 mg by mouth daily.   LOSARTAN (COZAAR) 100 MG TABLET    TAKE 1 TABLET(100 MG) BY MOUTH DAILY   MILK THISTLE 175 MG TABLET  Take 175 mg by mouth daily.   MULTIPLE VITAMINS-MINERALS (OCUVITE PO)    Take 1 capsule by mouth daily.    NUTRITIONAL SUPPLEMENTS (KIDNEY) CAPS    Take 2 capsules by mouth daily.   OMEGA-3 KRILL OIL 500 MG CAPS    Take 500 mg by mouth daily.   OMEPRAZOLE (PRILOSEC) 20 MG CAPSULE    Take 20 mg by mouth as needed.   PHENYLEPHRINE-ACETAMINOPHEN 5-325 MG TABS    Take 2 tablets by mouth as needed.   ROSUVASTATIN (CRESTOR) 10 MG TABLET    Take 1 tablet (10 mg total) by mouth daily.   SITAGLIPTIN (JANUVIA) 100 MG TABLET    Take 1/2 tablet by mouth once daily.   TAMSULOSIN (FLOMAX) 0.4 MG CAPS CAPSULE    TAKE 1 CAPSULE(0.4  MG) BY MOUTH DAILY  Modified Medications   No medications on file  Discontinued Medications   No medications on file    Physical Exam:  Vitals:   02/19/21 1120  BP: 138/90  Pulse: 96  Temp: (!) 97.5 F (36.4 C)  TempSrc: Temporal  SpO2: 98%  Weight: 291 lb (132 kg)  Height: 6' (1.829 m)  HC: 1" (2.5 cm)   Body mass index is 39.47 kg/m. Wt Readings from Last 3 Encounters:  02/19/21 291 lb (132 kg)  11/10/20 288 lb 9.6 oz (130.9 kg)  06/28/20 300 lb (136.1 kg)    Physical Exam Constitutional:      General: He is not in acute distress.    Appearance: He is well-developed. He is not diaphoretic.  HENT:     Head: Normocephalic and atraumatic.     Right Ear: External ear normal.     Left Ear: External ear normal.     Mouth/Throat:     Pharynx: No oropharyngeal exudate.  Eyes:     Conjunctiva/sclera: Conjunctivae normal.     Pupils: Pupils are equal, round, and reactive to light.  Cardiovascular:     Rate and Rhythm: Normal rate and regular rhythm.     Heart sounds: Normal heart sounds.  Pulmonary:     Effort: Pulmonary effort is normal.     Breath sounds: Normal breath sounds.  Abdominal:     General: Bowel sounds are normal.     Palpations: Abdomen is soft.  Musculoskeletal:        General: No tenderness.     Cervical back: Normal range of motion and neck supple.     Right lower leg: No edema.     Left lower leg: No edema.  Skin:    General: Skin is warm and dry.  Neurological:     Mental Status: He is alert and oriented to person, place, and time.  Psychiatric:        Mood and Affect: Mood normal.    Labs reviewed: Basic Metabolic Panel: Recent Labs    06/28/20 1415 11/10/20 1219  NA 139 140  K 4.7 4.3  CL 103 105  CO2 29 26  GLUCOSE 142* 117*  BUN 16 12  CREATININE 1.45* 1.17  CALCIUM 9.4 8.9   Liver Function Tests: Recent Labs    06/28/20 1415 11/10/20 1219  AST 15 15  ALT 17 16  BILITOT 0.4 0.4  PROT 7.5 7.1   No results for  input(s): LIPASE, AMYLASE in the last 8760 hours. No results for input(s): AMMONIA in the last 8760 hours. CBC: Recent Labs    06/28/20 1415 11/10/20 1219  WBC 7.4 6.3  NEUTROABS 4,373  3,610  HGB 12.7* 12.7*  HCT 40.0 40.6  MCV 80.3 82.0  PLT 282 325   Lipid Panel: Recent Labs    11/10/20 1219  CHOL 108  HDL 31*  LDLCALC 57  TRIG 117  CHOLHDL 3.5   TSH: No results for input(s): TSH in the last 8760 hours. A1C: Lab Results  Component Value Date   HGBA1C 7.6 (H) 11/10/2020     Assessment/Plan 1. Elevated PSA Followed by urology, recent MRI. Has follow up in December.   2. Essential hypertension -Blood pressure well controlled Continue current medications Recheck metabolic panel  3. Mixed hyperlipidemia -goal LDL <70, at [place] on crestor 10 mg daily   4. DDD (degenerative disc disease), lumbar -improvement in back pain, encouraged to continue exercises given by pt and weight loss.   5. CKD stage 3 secondary to diabetes (HCC) -Chronic and stable Encourage proper hydration Follow metabolic panel Avoid nephrotoxic meds (NSAIDS)  - CMP with eGFR([facility])  6. Need for influenza vaccination - Flu Vaccine QUAD High Dose(Fluad)  7. Benign prostatic hyperplasia with nocturia -stable, continues on flomax  8. Type 2 diabetes mellitus with diabetic neuropathy, without long-term current use of insulin (HCC) -discussed with pt that he did have diabetes and A1c was not controlled on last reading. He states he is taking farxiga and januvia -Encouraged dietary compliance, routine foot care/monitoring and to keep up with diabetic eye exams through ophthalmology  - Hemoglobin A1c - CMP with eGFR([facility]) - Ambulatory referral to Ophthalmology  9. Seasonal allergies - to stop benadryl due to side effects -zyrtec 10 mg daily -was previously seeing ENT but needs new referral.  - Ambulatory referral to ENT    Next appt: 3 months.  Jessica K. Eubanks, AGNP  [facility] [phone]   

## 2021-02-20 LAB — COMPLETE METABOLIC PANEL WITH GFR
AG Ratio: 1.5 (calc) (ref 1.0–2.5)
ALT: 11 U/L (ref 9–46)
AST: 13 U/L (ref 10–35)
Albumin: 4.4 g/dL (ref 3.6–5.1)
Alkaline phosphatase (APISO): 97 U/L (ref 35–144)
BUN: 14 mg/dL (ref 7–25)
CO2: 28 mmol/L (ref 20–32)
Calcium: 9.6 mg/dL (ref 8.6–10.3)
Chloride: 102 mmol/L (ref 98–110)
Creat: 1.22 mg/dL (ref 0.70–1.28)
Globulin: 2.9 g/dL (calc) (ref 1.9–3.7)
Glucose, Bld: 176 mg/dL — ABNORMAL HIGH (ref 65–139)
Potassium: 4.1 mmol/L (ref 3.5–5.3)
Sodium: 140 mmol/L (ref 135–146)
Total Bilirubin: 0.4 mg/dL (ref 0.2–1.2)
Total Protein: 7.3 g/dL (ref 6.1–8.1)
eGFR: 61 mL/min/{1.73_m2} (ref >=60)

## 2021-02-20 LAB — HEMOGLOBIN A1C
Hgb A1c MFr Bld: 7.2 % of total Hgb — ABNORMAL HIGH (ref <5.7)
Mean Plasma Glucose: 160 mg/dL
eAG (mmol/L): 8.9 mmol/L

## 2021-03-15 ENCOUNTER — Encounter: Payer: Self-pay | Admitting: Nurse Practitioner

## 2021-03-15 ENCOUNTER — Ambulatory Visit (INDEPENDENT_AMBULATORY_CARE_PROVIDER_SITE_OTHER): Payer: Medicare Other | Admitting: Nurse Practitioner

## 2021-03-15 ENCOUNTER — Other Ambulatory Visit: Payer: Self-pay

## 2021-03-15 DIAGNOSIS — Z0000 Encounter for general adult medical examination without abnormal findings: Secondary | ICD-10-CM | POA: Diagnosis not present

## 2021-03-15 NOTE — Progress Notes (Signed)
This service is provided via telemedicine  No vital signs collected/recorded due to the encounter was a telemedicine visit.   Location of patient (ex: home, work):  Home  Patient consents to a telephone visit:  Yes see encounter dated 10/31/2020  Location of the provider (ex: office, home):  [facility]   Name of any referring provider:  N/A  Names of all persons participating in the telemedicine service and their role in the encounter:  Abbey Chatters, Nurse Practitioner, Elveria Royals, CMA, and patient.   Time spent on call:  13 minutes with medical assistant

## 2021-03-15 NOTE — Progress Notes (Signed)
Subjective:   Darren Fisher is a 75 y.o. male who presents for Medicare Annual/Subsequent preventive examination.  Review of Systems           Objective:    There were no vitals filed for this visit. There is no height or weight on file to calculate BMI.  Advanced Directives 02/19/2021 11/22/2020 06/28/2020 12/29/2019 08/09/2019  Does Patient Have a Medical Advance Directive? No No No Yes No  Type of Advance Directive - - Web designer -  Does patient want to make changes to medical advance directive? - - - No - Patient declined -  Copy of Healthcare Power of Attorney in Chart? - - - No - copy requested -  Would patient like information on creating a medical advance directive? Yes (MAU/Ambulatory/Procedural Areas - Information given) Yes (MAU/Ambulatory/Procedural Areas - Information given) - - No - Patient declined    Current Medications (verified) Outpatient Encounter Medications as of 03/15/2021  Medication Sig   amLODipine (NORVASC) 10 MG tablet TAKE 1 TABLET(10 MG) BY MOUTH DAILY   Cyanocobalamin (B-12 PO) Take 1 tablet by mouth daily.   dapagliflozin propanediol (FARXIGA) 5 MG TABS tablet Take 1 tablet (5 mg total) by mouth daily before breakfast.   diphenhydrAMINE (BENADRYL) 25 MG tablet Take 25 mg by mouth as needed.   ELDERBERRY PO Take 50 mg by mouth daily.   losartan (COZAAR) 100 MG tablet TAKE 1 TABLET(100 MG) BY MOUTH DAILY   Metamucil Fiber CHEW Chew by mouth daily.   milk thistle 175 MG tablet Take 175 mg by mouth daily.   Multiple Vitamins-Minerals (OCUVITE PO) Take 1 capsule by mouth daily.    Omega-3 Krill Oil 500 MG CAPS Take 500 mg by mouth daily.   omeprazole (PRILOSEC) 20 MG capsule Take 20 mg by mouth as needed.   Phenylephrine-Acetaminophen 5-325 MG TABS Take 2 tablets by mouth as needed.   rosuvastatin (CRESTOR) 10 MG tablet Take 1 tablet (10 mg total) by mouth daily.   sitaGLIPtin (JANUVIA) 100 MG tablet Take 1/2 tablet by mouth once  daily.   tamsulosin (FLOMAX) 0.4 MG CAPS capsule TAKE 1 CAPSULE(0.4 MG) BY MOUTH DAILY   Nutritional Supplements (KIDNEY) CAPS Take 2 capsules by mouth daily. (Patient not taking: Reported on 02/19/2021)   No facility-administered encounter medications on file as of 03/15/2021.    Allergies (verified) Patient has no known allergies.   History: Past Medical History:  Diagnosis Date   History of colonoscopy    [facility]   History of MRI 04/01/2017   Hypertension    Wears glasses    Past Surgical History:  Procedure Laterality Date   INGUINAL HERNIA REPAIR Right 04/01/1965   Dr Nelma Rothman. [place] per [facility] new patient packet    INGUINAL HERNIA REPAIR Left 04/02/1991   [facility], Per [facility] New Patient Packet    Family History  Problem Relation Age of Onset   Asthma Father    Other Sister    Diabetes Brother    Congestive Heart Failure Brother    Stomach cancer Sister    Obesity Brother    Social History   Socioeconomic History   Marital status: Married    Spouse name: Not on file   Number of children: Not on file   Years of education: Not on file   Highest education level: Not on file  Occupational History   Not on file  Tobacco Use   Smoking status: Never  Smokeless tobacco: Never  Vaping Use   Vaping Use: Never used  Substance and Sexual Activity   Alcohol use: Not Currently   Drug use: Never   Sexual activity: Not on file  Other Topics Concern   Not on file  Social History Narrative   Tobacco use, amount per day now: None.   Past tobacco use, amount per day:   How many years did you use tobacco:   Alcohol use (drinks per week): Social ( some wine, some beer )   Diet:   Do you drink/eat things with caffeine: Soda.   Marital status: Married                                 What year were you married? 1969   Do you live in a house, apartment, assisted living, condo, trailer, etc.? House.   Is it one or more stories? Single  Floor.   How many persons live in your home? Two.   Do you have pets in your home?( please list) None.   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   Do you exercise? Walking.                                    Type and how often? Daily   Do you have a living will? No   Do you have a DNR form? No                                   If not, do you want to discuss one?   Do you have signed POA/HPOA forms? No                       If so, please bring to you appointment    Do you have any difficulty bathing or dressing yourself? No   Do you have difficulty preparing food or eating? No   Do you have difficulty managing your medications? No   Do you have any difficulty managing your finances? No   Do you have any difficulty affording your medications? No      Social Determinants of Corporate investment banker Strain: Not on file  Food Insecurity: Not on file  Transportation Needs: Not on file  Physical Activity: Not on file  Stress: Not on file  Social Connections: Not on file    Tobacco Counseling Counseling given: Not Answered   Clinical Intake:                 Diabetic?yes         Activities of Daily Living No flowsheet data found.  Patient Care Team: Sharon Seller, NP as PCP - General (Geriatric Medicine)  Indicate any recent Medical Services you may have received from other than [facility] providers in the past year (date may be approximate).     Assessment:   This is a routine wellness examination for [place].  Hearing/Vision screen Hearing Screening - Comments:: Noi problems with hearing Vision Screening - Comments:: Patient has cataracts. Patient has had eye exam within past year. Patient sees Dr Elmer Picker.  Dietary issues and exercise activities discussed:     Goals Addressed   None   Depression  Screen PHQ 2/9 Scores 03/15/2021 02/19/2021 06/28/2020  PHQ - 2 Score 0 0 0    Fall Risk Fall Risk   03/15/2021 02/19/2021 06/28/2020  Falls in the past year? - 1 0  Number falls in past yr: 0 0 0  Injury with Fall? 0 0 0  Risk for fall due to : No Fall Risks No Fall Risks -  Follow up Falls evaluation completed Falls evaluation completed -    FALL RISK PREVENTION PERTAINING TO THE HOME:  Any stairs in or around the home? No  If so, are there any without handrails? No  Home free of loose throw rugs in walkways, pet beds, electrical cords, etc? Yes  Adequate lighting in your home to reduce risk of falls? Yes   ASSISTIVE DEVICES UTILIZED TO PREVENT FALLS:  Life alert? No  Use of a cane, walker or w/c? No  Grab bars in the bathroom? Yes  Shower chair or bench in shower? Yes  Elevated toilet seat or a handicapped toilet? Yes   TIMED UP AND GO:  Was the test performed? No .   Cognitive Function:     6CIT Screen 03/15/2021  What Year? 0 points  What month? 0 points  What time? 0 points  Count back from 20 0 points  Months in reverse 0 points  Repeat phrase 0 points  Total Score 0    Immunizations Immunization History  Administered Date(s) Administered   Fluad Quad(high Dose 65+) 12/29/2019, 02/19/2021   PFIZER(Purple Top)SARS-COV-2 Vaccination 04/23/2019, 05/14/2019, 01/27/2020   Pneumococcal Conjugate-13 11/10/2020   Pneumococcal Polysaccharide-23 04/01/2017   Zoster Recombinat (Shingrix) 09/02/2019    TDAP status: Due, Education has been provided regarding the importance of this vaccine. Advised may receive this vaccine at local pharmacy or Health Dept. Aware to provide a copy of the vaccination record if obtained from local pharmacy or Health Dept. Verbalized acceptance and understanding.  Flu Vaccine status: Up to date  Pneumococcal vaccine status: Up to date  Covid-19 vaccine status: Information provided on how to obtain vaccines.   Qualifies for Shingles Vaccine? Yes   Zostavax completed No   Shingrix Completed?: Yes  Screening Tests Health Maintenance   Topic Date Due   TETANUS/TDAP  Never done   Zoster Vaccines- Shingrix (2 of 2) 10/28/2019   COVID-19 Vaccine (4 - Booster for Pfizer series) 03/23/2020   OPHTHALMOLOGY EXAM  12/07/2020   HEMOGLOBIN A1C  08/19/2021   FOOT EXAM  11/10/2021   Pneumonia Vaccine 66+ Years old  Completed   INFLUENZA VACCINE  Completed   Hepatitis C Screening  Completed   HPV VACCINES  Aged Out   COLONOSCOPY (Pts 45-72yrs Insurance coverage will need to be confirmed)  Discontinued    Health Maintenance  Health Maintenance Due  Topic Date Due   TETANUS/TDAP  Never done   Zoster Vaccines- Shingrix (2 of 2) 10/28/2019   COVID-19 Vaccine (4 - Booster for Pfizer series) 03/23/2020   OPHTHALMOLOGY EXAM  12/07/2020    Colorectal cancer screening: No longer required.   Lung Cancer Screening: (Low Dose CT Chest recommended if Age 26-80 years, 30 pack-year currently smoking OR have quit w/in 15years.) does not qualify.   Lung Cancer Screening Referral: na  Additional Screening:  Hepatitis C Screening: does qualify; Completed   Vision Screening: Recommended annual ophthalmology exams for early detection of glaucoma and other disorders of the eye. Is the patient up to date with their annual eye exam?  Yes  Who is the provider  or what is the name of the office in which the patient attends annual eye exams? [facility] If pt is not established with a provider, would they like to be referred to a provider to establish care? No .   Dental Screening: Recommended annual dental exams for proper oral hygiene  Community Resource Referral / Chronic Care Management: CRR required this visit?  No   CCM required this visit?  No      Plan:     I have personally reviewed and noted the following in the patients chart:   Medical and social history Use of alcohol, tobacco or illicit drugs  Current medications and supplements including opioid prescriptions. Patient is not currently taking opioid  prescriptions. Functional ability and status Nutritional status Physical activity Advanced directives List of other physicians Hospitalizations, surgeries, and ER visits in previous 12 months Vitals Screenings to include cognitive, depression, and falls Referrals and appointments  In addition, I have reviewed and discussed with patient certain preventive protocols, quality metrics, and best practice recommendations. A written personalized care plan for preventive services as well as general preventive health recommendations were provided to patient.     Sharon Seller, NP   03/15/2021   Virtual Visit via Telephone Note  I connected withNAME@ on 03/15/21 at  9:30 AM EST by telephone and verified that I am speaking with the correct person using two identifiers.  Location: Patient: home Provider: [facility]   I discussed the limitations, risks, security and privacy concerns of performing an evaluation and management service by telephone and the availability of in person appointments. I also discussed with the patient that there may be a patient responsible charge related to this service. The patient expressed understanding and agreed to proceed.   I discussed the assessment and treatment plan with the patient. The patient was provided an opportunity to ask questions and all were answered. The patient agreed with the plan and demonstrated an understanding of the instructions.   The patient was advised to call back or seek an in-person evaluation if the symptoms worsen or if the condition fails to improve as anticipated.  I provided 15 minutes of non-face-to-face time during this encounter.  Janene Harvey. [employer] printed and mailed

## 2021-03-15 NOTE — Patient Instructions (Signed)
Darren Fisher , Thank you for taking time to come for your Medicare Wellness Visit. I appreciate your ongoing commitment to your health goals. Please review the following plan we discussed and let me know if I can assist you in the future.   Screening recommendations/referrals: Colonoscopy aged out Recommended yearly ophthalmology/optometry visit for glaucoma screening and checkup Recommended yearly dental visit for hygiene and checkup  Vaccinations: Influenza vaccine up to date Pneumococcal vaccine up to date Tdap vaccine RE COMMENDED_ to get at local pharmacy Shingles vaccine up to date COVID- recommended to get booster    Advanced directives: recommended to complete and have notarized and bring back to place on file.   Conditions/risks identified: advance age, obesity, complications related to diabetes.   Next appointment: yearly for awv  Preventive Care 65 Years and Older, Male Preventive care refers to lifestyle choices and visits with your health care provider that can promote health and wellness. What does preventive care include? A yearly physical exam. This is also called an annual well check. Dental exams once or twice a year. Routine eye exams. Ask your health care provider how often you should have your eyes checked. Personal lifestyle choices, including: Daily care of your teeth and gums. Regular physical activity. Eating a healthy diet. Avoiding tobacco and drug use. Limiting alcohol use. Practicing safe sex. Taking low doses of aspirin every day. Taking vitamin and mineral supplements as recommended by your health care provider. What happens during an annual well check? The services and screenings done by your health care provider during your annual well check will depend on your age, overall health, lifestyle risk factors, and family history of disease. Counseling  Your health care provider may ask you questions about your: Alcohol use. Tobacco use. Drug  use. Emotional well-being. Home and relationship well-being. Sexual activity. Eating habits. History of falls. Memory and ability to understand (cognition). Work and work Astronomer. Screening  You may have the following tests or measurements: Height, weight, and BMI. Blood pressure. Lipid and cholesterol levels. These may be checked every 5 years, or more frequently if you are over 61 years old. Skin check. Lung cancer screening. You may have this screening every year starting at age 9 if you have a 30-pack-year history of smoking and currently smoke or have quit within the past 15 years. Fecal occult blood test (FOBT) of the stool. You may have this test every year starting at age 29. Flexible sigmoidoscopy or colonoscopy. You may have a sigmoidoscopy every 5 years or a colonoscopy every 10 years starting at age 66. Prostate cancer screening. Recommendations will vary depending on your family history and other risks. Hepatitis C blood test. Hepatitis B blood test. Sexually transmitted disease (STD) testing. Diabetes screening. This is done by checking your blood sugar (glucose) after you have not eaten for a while (fasting). You may have this done every 1-3 years. Abdominal aortic aneurysm (AAA) screening. You may need this if you are a current or former smoker. Osteoporosis. You may be screened starting at age 33 if you are at high risk. Talk with your health care provider about your test results, treatment options, and if necessary, the need for more tests. Vaccines  Your health care provider may recommend certain vaccines, such as: Influenza vaccine. This is recommended every year. Tetanus, diphtheria, and acellular pertussis (Tdap, Td) vaccine. You may need a Td booster every 10 years. Zoster vaccine. You may need this after age 68. Pneumococcal 13-valent conjugate (PCV13) vaccine. One dose  is recommended after age 64. Pneumococcal polysaccharide (PPSV23) vaccine. One dose is  recommended after age 62. Talk to your health care provider about which screenings and vaccines you need and how often you need them. This information is not intended to replace advice given to you by your health care provider. Make sure you discuss any questions you have with your health care provider. Document Released: 04/14/2015 Document Revised: 12/06/2015 Document Reviewed: 01/17/2015 Elsevier Interactive Patient Education  2017 [place] Prevention in the Home Falls can cause injuries. They can happen to people of all ages. There are many things you can do to make your home safe and to help prevent falls. What can I do on the outside of my home? Regularly fix the edges of walkways and driveways and fix any cracks. Remove anything that might make you trip as you walk through a door, such as a raised step or threshold. Trim any bushes or trees on the path to your home. Use bright outdoor lighting. Clear any walking paths of anything that might make someone trip, such as rocks or tools. Regularly check to see if handrails are loose or broken. Make sure that both sides of any steps have handrails. Any raised decks and porches should have guardrails on the edges. Have any leaves, snow, or ice cleared regularly. Use sand or salt on walking paths during winter. Clean up any spills in your garage right away. This includes oil or grease spills. What can I do in the bathroom? Use night lights. Install grab bars by the toilet and in the tub and shower. Do not use towel bars as grab bars. Use non-skid mats or decals in the tub or shower. If you need to sit down in the shower, use a plastic, non-slip stool. Keep the floor dry. Clean up any water that spills on the floor as soon as it happens. Remove soap buildup in the tub or shower regularly. Attach bath mats securely with double-sided non-slip rug tape. Do not have throw rugs and other things on the floor that can make you  trip. What can I do in the bedroom? Use night lights. Make sure that you have a light by your bed that is easy to reach. Do not use any sheets or blankets that are too big for your bed. They should not hang down onto the floor. Have a firm chair that has side arms. You can use this for support while you get dressed. Do not have throw rugs and other things on the floor that can make you trip. What can I do in the kitchen? Clean up any spills right away. Avoid walking on wet floors. Keep items that you use a lot in easy-to-reach places. If you need to reach something above you, use a strong step stool that has a grab bar. Keep electrical cords out of the way. Do not use floor polish or wax that makes floors slippery. If you must use wax, use non-skid floor wax. Do not have throw rugs and other things on the floor that can make you trip. What can I do with my stairs? Do not leave any items on the stairs. Make sure that there are handrails on both sides of the stairs and use them. Fix handrails that are broken or loose. Make sure that handrails are as long as the stairways. Check any carpeting to make sure that it is firmly attached to the stairs. Fix any carpet that is loose or worn. Avoid  having throw rugs at the top or bottom of the stairs. If you do have throw rugs, attach them to the floor with carpet tape. Make sure that you have a light switch at the top of the stairs and the bottom of the stairs. If you do not have them, ask someone to add them for you. What else can I do to help prevent falls? Wear shoes that: Do not have high heels. Have rubber bottoms. Are comfortable and fit you well. Are closed at the toe. Do not wear sandals. If you use a stepladder: Make sure that it is fully opened. Do not climb a closed stepladder. Make sure that both sides of the stepladder are locked into place. Ask someone to hold it for you, if possible. Clearly mark and make sure that you can  see: Any grab bars or handrails. First and last steps. Where the edge of each step is. Use tools that help you move around (mobility aids) if they are needed. These include: Canes. Walkers. Scooters. Crutches. Turn on the lights when you go into a dark area. Replace any light bulbs as soon as they burn out. Set up your furniture so you have a clear path. Avoid moving your furniture around. If any of your floors are uneven, fix them. If there are any pets around you, be aware of where they are. Review your medicines with your doctor. Some medicines can make you feel dizzy. This can increase your chance of falling. Ask your doctor what other things that you can do to help prevent falls. This information is not intended to replace advice given to you by your health care provider. Make sure you discuss any questions you have with your health care provider. Document Released: 01/12/2009 Document Revised: 08/24/2015 Document Reviewed: 04/22/2014 Elsevier Interactive Patient Education  2017 [employer].

## 2021-05-11 HISTORY — PX: BIOPSY PROSTATE: PRO28

## 2021-05-14 ENCOUNTER — Ambulatory Visit: Payer: Federal, State, Local not specified - PPO | Admitting: Nurse Practitioner

## 2021-05-23 ENCOUNTER — Other Ambulatory Visit: Payer: Medicare Other

## 2021-05-23 ENCOUNTER — Other Ambulatory Visit: Payer: Self-pay

## 2021-05-23 DIAGNOSIS — E1140 Type 2 diabetes mellitus with diabetic neuropathy, unspecified: Secondary | ICD-10-CM

## 2021-05-23 LAB — BASIC METABOLIC PANEL WITH GFR
BUN: 14 mg/dL (ref 7–25)
CO2: 28 mmol/L (ref 20–32)
Calcium: 9.7 mg/dL (ref 8.6–10.3)
Chloride: 102 mmol/L (ref 98–110)
Creat: 1.25 mg/dL (ref 0.70–1.28)
Glucose, Bld: 174 mg/dL — ABNORMAL HIGH (ref 65–99)
Potassium: 4.6 mmol/L (ref 3.5–5.3)
Sodium: 137 mmol/L (ref 135–146)
eGFR: 60 mL/min/{1.73_m2} (ref >=60)

## 2021-05-25 ENCOUNTER — Other Ambulatory Visit: Payer: Self-pay

## 2021-05-25 ENCOUNTER — Encounter: Payer: Self-pay | Admitting: Nurse Practitioner

## 2021-05-25 ENCOUNTER — Ambulatory Visit (INDEPENDENT_AMBULATORY_CARE_PROVIDER_SITE_OTHER): Payer: Medicare Other | Admitting: Nurse Practitioner

## 2021-05-25 VITALS — BP 134/80 | HR 102 | Temp 97.3°F | Ht 72.0 in | Wt 289.0 lb

## 2021-05-25 DIAGNOSIS — I10 Essential (primary) hypertension: Secondary | ICD-10-CM | POA: Diagnosis not present

## 2021-05-25 DIAGNOSIS — N183 Chronic kidney disease, stage 3 unspecified: Secondary | ICD-10-CM

## 2021-05-25 DIAGNOSIS — N401 Enlarged prostate with lower urinary tract symptoms: Secondary | ICD-10-CM

## 2021-05-25 DIAGNOSIS — E1140 Type 2 diabetes mellitus with diabetic neuropathy, unspecified: Secondary | ICD-10-CM

## 2021-05-25 DIAGNOSIS — E1122 Type 2 diabetes mellitus with diabetic chronic kidney disease: Secondary | ICD-10-CM

## 2021-05-25 DIAGNOSIS — R351 Nocturia: Secondary | ICD-10-CM

## 2021-05-25 DIAGNOSIS — M5136 Other intervertebral disc degeneration, lumbar region: Secondary | ICD-10-CM | POA: Diagnosis not present

## 2021-05-25 DIAGNOSIS — E782 Mixed hyperlipidemia: Secondary | ICD-10-CM | POA: Diagnosis not present

## 2021-05-25 NOTE — Progress Notes (Signed)
Careteam: Patient Care Team: Lauree Chandler, NP as PCP - General (Geriatric Medicine)  PLACE OF SERVICE:  [place] Directive information Does Patient Have a Medical Advance Directive?: No, Would patient like information on creating a medical advance directive?: No - Patient declined  No Known Allergies  Chief Complaint  Patient presents with   Medical Management of Chronic Issues    3 month follow-up recent labs discussed with patient. Discuss need for td/tdap, covid boosters, chingrix, and eye exam or post pone if patient refuses. Patient denies receiving any vaccines since last visit. Ongoing sinus and hernia concerns. Patients wife called and stated patients hands need to be evaluated, specifically right hand.       HPI: Patient is a 76 y.o. male for routine follow up.   Has biopsy for prostate and was negative. He was given finasteride for symptom management.  Also taking flomax.   Htn- controlled on norvasc, losartan  DM- does not check blood sugars at home. Reports he tries to do better. He has cut back on sugary foods.   GERD- using omeprazole- as needed- reports this is effective in controlling symptoms.    Ongoing back pain, continues to do exercises. Has pain but continues to function. Does not require medication.   Review of Systems:  Review of Systems  Constitutional:  Negative for chills, fever and weight loss.  HENT:  Negative for tinnitus.   Respiratory:  Negative for cough, sputum production and shortness of breath.   Cardiovascular:  Negative for chest pain, palpitations and leg swelling.  Gastrointestinal:  Negative for abdominal pain, constipation, diarrhea and heartburn.  Genitourinary:  Negative for dysuria, frequency and urgency.  Musculoskeletal:  Negative for back pain, falls, joint pain and myalgias.  Skin: Negative.   Neurological:  Negative for dizziness and headaches.  Psychiatric/Behavioral:  Negative for depression and  memory loss. The patient does not have insomnia.    Past Medical History:  Diagnosis Date   History of colonoscopy    [facility]   History of MRI 04/01/2017   Hypertension    Wears glasses    Past Surgical History:  Procedure Laterality Date   BIOPSY PROSTATE  05/11/2021   INGUINAL HERNIA REPAIR Right 04/01/1965   Dr Vassie Moment. [place] per [facility] new patient packet    INGUINAL HERNIA REPAIR Left 04/02/1991   [facility], Per [facility] New Patient Packet    Social History:   reports that he has never smoked. He has never used smokeless tobacco. He reports that he does not currently use alcohol. He reports that he does not use drugs.  Family History  Problem Relation Age of Onset   Asthma Father    Other Sister    Diabetes Brother    Congestive Heart Failure Brother    Stomach cancer Sister    Obesity Brother     Medications: Patient's Medications  New Prescriptions   No medications on file  Previous Medications   AMLODIPINE (NORVASC) 10 MG TABLET    TAKE 1 TABLET(10 MG) BY MOUTH DAILY   CYANOCOBALAMIN (B-12 PO)    Take 1 tablet by mouth daily.   DAPAGLIFLOZIN PROPANEDIOL (FARXIGA) 5 MG TABS TABLET    Take 1 tablet (5 mg total) by mouth daily before breakfast.   DIPHENHYDRAMINE (BENADRYL) 25 MG TABLET    Take 25 mg by mouth as needed.   ELDERBERRY PO    Take 50 mg by mouth daily.   FINASTERIDE (  PROSCAR) 5 MG TABLET    Take 5 mg by mouth daily.   LOSARTAN (COZAAR) 100 MG TABLET    TAKE 1 TABLET(100 MG) BY MOUTH DAILY   METAMUCIL FIBER CHEW    Chew by mouth daily.   MILK THISTLE 175 MG TABLET    Take 175 mg by mouth daily.   MULTIPLE VITAMINS-MINERALS (OCUVITE PO)    Take 1 capsule by mouth daily.    NUTRITIONAL SUPPLEMENTS (KIDNEY) CAPS    Take 2 capsules by mouth daily.   OMEGA-3 KRILL OIL 500 MG CAPS    Take 500 mg by mouth daily.   OMEPRAZOLE (PRILOSEC) 20 MG CAPSULE    Take 20 mg by mouth as needed.   PHENYLEPHRINE-ACETAMINOPHEN 5-325 MG TABS     Take 2 tablets by mouth as needed.   ROSUVASTATIN (CRESTOR) 10 MG TABLET    Take 1 tablet (10 mg total) by mouth daily.   SITAGLIPTIN (JANUVIA) 100 MG TABLET    Take 1/2 tablet by mouth once daily.   TAMSULOSIN (FLOMAX) 0.4 MG CAPS CAPSULE    TAKE 1 CAPSULE(0.4 MG) BY MOUTH DAILY  Modified Medications   No medications on file  Discontinued Medications   No medications on file    Physical Exam:  Vitals:   05/25/21 1319  BP: 134/80  Pulse: (!) 102  Temp: (!) 97.3 F (36.3 C)  TempSrc: Temporal  SpO2: 97%  Weight: 289 lb (131.1 kg)  Height: 6' (1.829 m)   Body mass index is 39.2 kg/m. Wt Readings from Last 3 Encounters:  05/25/21 289 lb (131.1 kg)  02/19/21 291 lb (132 kg)  11/10/20 288 lb 9.6 oz (130.9 kg)    Physical Exam Constitutional:      General: He is not in acute distress.    Appearance: He is well-developed. He is not diaphoretic.  HENT:     Head: Normocephalic and atraumatic.     Right Ear: External ear normal.     Left Ear: External ear normal.     Mouth/Throat:     Pharynx: No oropharyngeal exudate.  Eyes:     Conjunctiva/sclera: Conjunctivae normal.     Pupils: Pupils are equal, round, and reactive to light.  Cardiovascular:     Rate and Rhythm: Normal rate and regular rhythm.     Heart sounds: Normal heart sounds.  Pulmonary:     Effort: Pulmonary effort is normal.     Breath sounds: Normal breath sounds.  Abdominal:     General: Bowel sounds are normal.     Palpations: Abdomen is soft.  Musculoskeletal:        General: No tenderness.     Cervical back: Normal range of motion and neck supple.     Right lower leg: No edema.     Left lower leg: No edema.  Skin:    General: Skin is warm and dry.  Neurological:     Mental Status: He is alert and oriented to person, place, and time.    Labs reviewed: Basic Metabolic Panel: Recent Labs    11/10/20 1219 02/19/21 1209 05/23/21 1053  NA 140 140 137  K 4.3 4.1 4.6  CL 105 102 102  CO2 26  28 28   GLUCOSE 117* 176* 174*  BUN 12 14 14   CREATININE 1.17 1.22 1.25  CALCIUM 8.9 9.6 9.7   Liver Function Tests: Recent Labs    06/28/20 1415 11/10/20 1219 02/19/21 1209  AST 15 15 13   ALT 17 16 11  BILITOT 0.4 0.4 0.4  PROT 7.5 7.1 7.3   No results for input(s): LIPASE, AMYLASE in the last 8760 hours. No results for input(s): AMMONIA in the last 8760 hours. CBC: Recent Labs    06/28/20 1415 11/10/20 1219  WBC 7.4 6.3  NEUTROABS 4,373 3,610  HGB 12.7* 12.7*  HCT 40.0 40.6  MCV 80.3 82.0  PLT 282 325   Lipid Panel: Recent Labs    11/10/20 1219  CHOL 108  HDL 31*  LDLCALC 57  TRIG 117  CHOLHDL 3.5   TSH: No results for input(s): TSH in the last 8760 hours. A1C: Lab Results  Component Value Date   HGBA1C 7.2 (H) 02/19/2021     Assessment/Plan 1. Essential hypertension -Blood pressure well controlled Continue current medications Recheck metabolic panel - CMP with eGFR([facility]); Future - CBC with Differential/Platelet; Future  2. DDD (degenerative disc disease), lumbar Stable at this time, continue exercises.   3. Mixed hyperlipidemia -continues crestor 10 mg daily with dietary modifications . - Lipid panel; Future  4. 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 Benign prostatic hyperplasia with nocturia -stable, continues on flomax and proscar.  6. Type 2 diabetes mellitus with diabetic neuropathy, without long-term current use of insulin ([place]) -Encouraged dietary compliance, routine foot care/monitoring and to keep up with diabetic eye exams through ophthalmology  Continues on farxiga and januvia, may need additional medication if A1c not at goal.  - Hemoglobin A1c - Hemoglobin A1c; Future - CMP with eGFR([facility]); Future   Return in about 4 months (around 09/22/2021) for routine follow  up . Carlos American. [place], [facility] [phone]

## 2021-05-25 NOTE — Patient Instructions (Signed)
To get 2nd shingles shot TDAP and COVID booster- all at local pharmacy

## 2021-05-26 LAB — HEMOGLOBIN A1C
Hgb A1c MFr Bld: 7.5 % of total Hgb — ABNORMAL HIGH (ref <5.7)
Mean Plasma Glucose: 169 mg/dL
eAG (mmol/L): 9.3 mmol/L

## 2021-05-29 ENCOUNTER — Other Ambulatory Visit: Payer: Self-pay

## 2021-05-29 ENCOUNTER — Other Ambulatory Visit: Payer: Self-pay | Admitting: Nurse Practitioner

## 2021-05-29 DIAGNOSIS — N401 Enlarged prostate with lower urinary tract symptoms: Secondary | ICD-10-CM

## 2021-05-29 DIAGNOSIS — E782 Mixed hyperlipidemia: Secondary | ICD-10-CM

## 2021-05-29 MED ORDER — METFORMIN HCL 500 MG PO TABS: ORAL_TABLET | ORAL | 3 refills | Status: DC

## 2021-05-29 NOTE — Addendum Note (Signed)
Addended by: Lauree Chandler on: 05/29/2021 08:36 AM   Modules accepted: Orders

## 2021-07-30 HISTORY — PX: CATARACT EXTRACTION: SUR2

## 2021-09-26 ENCOUNTER — Other Ambulatory Visit: Payer: Medicare Other

## 2021-09-28 ENCOUNTER — Ambulatory Visit: Payer: Medicare Other | Admitting: Nurse Practitioner

## 2021-10-26 ENCOUNTER — Ambulatory Visit: Payer: Medicare Other | Admitting: Nurse Practitioner

## 2021-10-30 HISTORY — PX: CATARACT EXTRACTION: SUR2

## 2021-12-07 ENCOUNTER — Encounter: Payer: Self-pay | Admitting: Nurse Practitioner

## 2021-12-07 ENCOUNTER — Ambulatory Visit (INDEPENDENT_AMBULATORY_CARE_PROVIDER_SITE_OTHER): Payer: Medicare Other | Admitting: Nurse Practitioner

## 2021-12-07 VITALS — BP 130/82 | HR 91 | Temp 97.5°F | Resp 18 | Ht 72.0 in | Wt 284.4 lb

## 2021-12-07 DIAGNOSIS — E1122 Type 2 diabetes mellitus with diabetic chronic kidney disease: Secondary | ICD-10-CM

## 2021-12-07 DIAGNOSIS — E782 Mixed hyperlipidemia: Secondary | ICD-10-CM

## 2021-12-07 DIAGNOSIS — Z23 Encounter for immunization: Secondary | ICD-10-CM | POA: Diagnosis not present

## 2021-12-07 DIAGNOSIS — N183 Chronic kidney disease, stage 3 unspecified: Secondary | ICD-10-CM

## 2021-12-07 DIAGNOSIS — L602 Onychogryphosis: Secondary | ICD-10-CM

## 2021-12-07 DIAGNOSIS — E1140 Type 2 diabetes mellitus with diabetic neuropathy, unspecified: Secondary | ICD-10-CM

## 2021-12-07 DIAGNOSIS — M5136 Other intervertebral disc degeneration, lumbar region: Secondary | ICD-10-CM | POA: Diagnosis not present

## 2021-12-07 NOTE — Patient Instructions (Addendum)
Please sign a record release for ophthalmology on check out. Need diabetic eye exam  Can use tylenol 325 mg 2 tablets every 6 hours as need pain.    To get TDAP abd  COVID vaccine at local pharmacy.   Have pharmacy send [place] your shingles record

## 2021-12-07 NOTE — Progress Notes (Signed)
Careteam: Patient Care Team: Lauree Chandler, NP as PCP - General (Geriatric Medicine)  PLACE OF SERVICE:  [place] Directive information    No Known Allergies  Chief Complaint  Patient presents with   Medical Management of Chronic Issues    Routine Visit.    Health Maintenance    Discuss the need for Eye exam, and Foot exam.    Immunizations    Discuss the need for Tetanus vaccine, Covid Booster, Shingrix vaccine, and Influenza vaccine.   Labs     Fasting labs.     HPI: Patient is a 76 y.o. male for routine follow up.  Has been staying in [place] with daughter helping her.   Ongoing back pain- exercises helped but he did not keep up with them. No numbness or tingling. Worse when he gets up. He feels like back wont support him.   DM- continues on fraxiga, januvia, and metformin.  He does not want to be on any injection for his diabetes.   Hyperlipidemia- continues on crestor.   BPH- on proscar and flomax. followed by urology, had prostate biopsy that looked good.  Getting up 2-3 times at night.   Htn- controlled on norvasc and losartan    Review of Systems:  Review of Systems  Constitutional:  Negative for chills, fever and weight loss.  HENT:  Negative for tinnitus.   Respiratory:  Negative for cough, sputum production and shortness of breath.   Cardiovascular:  Negative for chest pain, palpitations and leg swelling.  Gastrointestinal:  Negative for abdominal pain, constipation, diarrhea and heartburn.  Genitourinary:  Negative for dysuria, frequency and urgency.  Musculoskeletal:  Positive for back pain. Negative for falls, joint pain and myalgias.  Skin: Negative.   Neurological:  Negative for dizziness and headaches.  Psychiatric/Behavioral:  Negative for depression and memory loss. The patient does not have insomnia.     Past Medical History:  Diagnosis Date   History of colonoscopy    [facility]   History of MRI  04/01/2017   Hypertension    Wears glasses    Past Surgical History:  Procedure Laterality Date   BIOPSY PROSTATE  05/11/2021   INGUINAL HERNIA REPAIR Right 04/01/1965   Dr Vassie Moment. [place] per [facility] new patient packet    INGUINAL HERNIA REPAIR Left 04/02/1991   [facility], Per [facility] New Patient Packet    Social History:   reports that he has never smoked. He has never used smokeless tobacco. He reports that he does not currently use alcohol. He reports that he does not use drugs.  Family History  Problem Relation Age of Onset   Asthma Father    Other Sister    Diabetes Brother    Congestive Heart Failure Brother    Stomach cancer Sister    Obesity Brother     Medications: Patient's Medications  New Prescriptions   No medications on file  Previous Medications   AMLODIPINE (NORVASC) 10 MG TABLET    TAKE 1 TABLET(10 MG) BY MOUTH DAILY   CYANOCOBALAMIN (B-12 PO)    Take 1 tablet by mouth daily.   DAPAGLIFLOZIN PROPANEDIOL (FARXIGA) 5 MG TABS TABLET    Take 1 tablet (5 mg total) by mouth daily before breakfast.   DIPHENHYDRAMINE (BENADRYL) 25 MG TABLET    Take 25 mg by mouth as needed.   ELDERBERRY PO    Take 50 mg by mouth daily.   FINASTERIDE (PROSCAR) 5 MG TABLET  Take 5 mg by mouth daily.   LOSARTAN (COZAAR) 100 MG TABLET    TAKE 1 TABLET(100 MG) BY MOUTH DAILY   METAMUCIL FIBER CHEW    Chew by mouth daily.   METFORMIN (GLUCOPHAGE) 500 MG TABLET    To start 500 mg by mouth daily at breakfast x 7 days then increase to twice daily with breakfast and supper x7 days then increase to 1000 mg at breaskfast and 500 mg at supper x 7 days then 1000 mg by mouth twice daily with breakfast and supper   MILK THISTLE 175 MG TABLET    Take 175 mg by mouth daily.   MULTIPLE VITAMINS-MINERALS (OCUVITE PO)    Take 1 capsule by mouth daily.    NUTRITIONAL SUPPLEMENTS (KIDNEY) CAPS    Take 2 capsules by mouth daily.   OMEGA-3 KRILL OIL 500 MG CAPS    Take 500 mg by mouth  daily.   OMEPRAZOLE (PRILOSEC) 20 MG CAPSULE    Take 20 mg by mouth as needed.   PHENYLEPHRINE-ACETAMINOPHEN 5-325 MG TABS    Take 2 tablets by mouth as needed.   ROSUVASTATIN (CRESTOR) 10 MG TABLET    TAKE 1 TABLET(10 MG) BY MOUTH DAILY   SITAGLIPTIN (JANUVIA) 100 MG TABLET    TAKE 1/2 TABLET BY MOUTH EVERY DAY   TAMSULOSIN (FLOMAX) 0.4 MG CAPS CAPSULE    TAKE 1 CAPSULE(0.4 MG) BY MOUTH DAILY  Modified Medications   No medications on file  Discontinued Medications   No medications on file    Physical Exam:  Vitals:   12/07/21 0938  BP: 130/82  Pulse: 91  Resp: 18  Temp: (!) 97.5 F (36.4 C)  SpO2: 97%  Weight: 284 lb 6.4 oz (129 kg)  Height: 6' (1.829 m)   Body mass index is 38.57 kg/m. Wt Readings from Last 3 Encounters:  12/07/21 284 lb 6.4 oz (129 kg)  05/25/21 289 lb (131.1 kg)  02/19/21 291 lb (132 kg)    Physical Exam Constitutional:      General: He is not in acute distress.    Appearance: He is well-developed. He is obese. He is not diaphoretic.  HENT:     Head: Normocephalic and atraumatic.     Right Ear: External ear normal.     Left Ear: External ear normal.     Mouth/Throat:     Pharynx: No oropharyngeal exudate.  Eyes:     Conjunctiva/sclera: Conjunctivae normal.     Pupils: Pupils are equal, round, and reactive to light.  Cardiovascular:     Rate and Rhythm: Normal rate and regular rhythm.     Heart sounds: Normal heart sounds.  Pulmonary:     Effort: Pulmonary effort is normal.     Breath sounds: Normal breath sounds.  Abdominal:     General: Bowel sounds are normal.     Palpations: Abdomen is soft.  Musculoskeletal:        General: No tenderness.     Cervical back: Normal range of motion and neck supple.     Right lower leg: No edema.     Left lower leg: No edema.  Skin:    General: Skin is warm and dry.  Neurological:     Mental Status: He is alert and oriented to person, place, and time.    Labs reviewed: Basic Metabolic  Panel: Recent Labs    02/19/21 1209 05/23/21 1053  NA 140 137  K 4.1 4.6  CL 102 102  CO2  28 28  GLUCOSE 176* 174*  BUN 14 14  CREATININE 1.22 1.25  CALCIUM 9.6 9.7   Liver Function Tests: Recent Labs    02/19/21 1209  AST 13  ALT 11  BILITOT 0.4  PROT 7.3   No results for input(s): "LIPASE", "AMYLASE" in the last 8760 hours. No results for input(s): "AMMONIA" in the last 8760 hours. CBC: No results for input(s): "WBC", "NEUTROABS", "HGB", "HCT", "MCV", "PLT" in the last 8760 hours. Lipid Panel: No results for input(s): "CHOL", "HDL", "LDLCALC", "TRIG", "CHOLHDL", "LDLDIRECT" in the last 8760 hours. TSH: No results for input(s): "TSH" in the last 8760 hours. A1C: Lab Results  Component Value Date   HGBA1C 7.5 (H) 05/25/2021     Assessment/Plan 1. Need for influenza vaccination - Flu Vaccine QUAD High Dose(Fluad)  2. DDD (degenerative disc disease), lumbar Ongoing, exercises help with pain when he does them. Also encouraged weight loss which would benefit. Can use tylenol 325 mg 2 tablets every 6 hours as needed   3. Mixed hyperlipidemia -continues on crestor with dietary modification.  - Lipid panel  4. Morbid obesity ([place]) --education provided on healthy weight loss through increase in physical activity and proper nutrition--he is aware he needs to make lifestyle changes to lose weight.    5. CKD stage 3 secondary to diabetes (HCC) -Chronic and stable Encourage proper hydration Follow metabolic panel Avoid nephrotoxic meds (NSAIDS) - CBC with Differential/Platelet - CMP with eGFR([facility])  6. Type 2 diabetes mellitus with diabetic neuropathy, without long-term current use of insulin (HCC) -continue current medication, Encouraged dietary compliance, routine foot care/monitoring and to keep up with diabetic eye exams through ophthalmology  - Hemoglobin A1c - Ambulatory referral to Podiatry  7. Overgrown toenails - Ambulatory referral to  Podiatry   Return in about 6 months (around 06/07/2022) for routine follow up . Carlos American. [place], [facility] [phone]

## 2021-12-08 LAB — LIPID PANEL
Cholesterol: 121 mg/dL (ref <200)
HDL: 36 mg/dL — ABNORMAL LOW (ref >=40)
LDL Cholesterol (Calc): 67 mg/dL (calc)
Non-HDL Cholesterol (Calc): 85 mg/dL (calc) (ref <130)
Total CHOL/HDL Ratio: 3.4 (calc) (ref <5.0)
Triglycerides: 96 mg/dL (ref <150)

## 2021-12-08 LAB — COMPLETE METABOLIC PANEL WITH GFR
AG Ratio: 1.7 (calc) (ref 1.0–2.5)
ALT: 10 U/L (ref 9–46)
AST: 14 U/L (ref 10–35)
Albumin: 4.5 g/dL (ref 3.6–5.1)
Alkaline phosphatase (APISO): 74 U/L (ref 35–144)
BUN/Creatinine Ratio: 11 (calc) (ref 6–22)
BUN: 15 mg/dL (ref 7–25)
CO2: 23 mmol/L (ref 20–32)
Calcium: 9.3 mg/dL (ref 8.6–10.3)
Chloride: 103 mmol/L (ref 98–110)
Creat: 1.41 mg/dL — ABNORMAL HIGH (ref 0.70–1.28)
Globulin: 2.7 g/dL (calc) (ref 1.9–3.7)
Glucose, Bld: 129 mg/dL — ABNORMAL HIGH (ref 65–99)
Potassium: 4.2 mmol/L (ref 3.5–5.3)
Sodium: 138 mmol/L (ref 135–146)
Total Bilirubin: 0.5 mg/dL (ref 0.2–1.2)
Total Protein: 7.2 g/dL (ref 6.1–8.1)
eGFR: 51 mL/min/{1.73_m2} — ABNORMAL LOW (ref >=60)

## 2021-12-08 LAB — CBC WITH DIFFERENTIAL/PLATELET
Absolute Monocytes: 544 cells/uL (ref 200–950)
Basophils Absolute: 27 cells/uL (ref 0–200)
Basophils Relative: 0.4 %
Eosinophils Absolute: 279 cells/uL (ref 15–500)
Eosinophils Relative: 4.1 %
HCT: 39.5 % (ref 38.5–50.0)
Hemoglobin: 12.7 g/dL — ABNORMAL LOW (ref 13.2–17.1)
Lymphs Abs: 2094 cells/uL (ref 850–3900)
MCH: 26.1 pg — ABNORMAL LOW (ref 27.0–33.0)
MCHC: 32.2 g/dL (ref 32.0–36.0)
MCV: 81.3 fL (ref 80.0–100.0)
MPV: 9.3 fL (ref 7.5–12.5)
Monocytes Relative: 8 %
Neutro Abs: 3856 cells/uL (ref 1500–7800)
Neutrophils Relative %: 56.7 %
Platelets: 266 10*3/uL (ref 140–400)
RBC: 4.86 10*6/uL (ref 4.20–5.80)
RDW: 13.8 % (ref 11.0–15.0)
Total Lymphocyte: 30.8 %
WBC: 6.8 10*3/uL (ref 3.8–10.8)

## 2021-12-08 LAB — HEMOGLOBIN A1C
Hgb A1c MFr Bld: 6.6 % of total Hgb — ABNORMAL HIGH (ref <5.7)
Mean Plasma Glucose: 143 mg/dL
eAG (mmol/L): 7.9 mmol/L

## 2021-12-25 ENCOUNTER — Other Ambulatory Visit: Payer: Self-pay | Admitting: Nurse Practitioner

## 2022-01-21 ENCOUNTER — Other Ambulatory Visit: Payer: Self-pay | Admitting: Nurse Practitioner

## 2022-01-21 DIAGNOSIS — E782 Mixed hyperlipidemia: Secondary | ICD-10-CM

## 2022-01-29 ENCOUNTER — Ambulatory Visit (INDEPENDENT_AMBULATORY_CARE_PROVIDER_SITE_OTHER): Payer: Medicare Other | Admitting: Family

## 2022-01-29 ENCOUNTER — Encounter: Payer: Self-pay | Admitting: Family

## 2022-01-29 VITALS — BP 126/70 | HR 99 | Temp 99.5°F | Resp 18 | Ht 72.0 in | Wt 281.2 lb

## 2022-01-29 DIAGNOSIS — R5383 Other fatigue: Secondary | ICD-10-CM | POA: Diagnosis not present

## 2022-01-29 DIAGNOSIS — R0981 Nasal congestion: Secondary | ICD-10-CM

## 2022-01-29 DIAGNOSIS — R350 Frequency of micturition: Secondary | ICD-10-CM

## 2022-01-29 DIAGNOSIS — R52 Pain, unspecified: Secondary | ICD-10-CM

## 2022-01-29 DIAGNOSIS — R509 Fever, unspecified: Secondary | ICD-10-CM

## 2022-01-29 LAB — POC COVID19 BINAXNOW: SARS Coronavirus 2 Ag: NEGATIVE

## 2022-01-29 LAB — POCT INFLUENZA A/B
Influenza A, POC: NEGATIVE
Influenza B, POC: NEGATIVE

## 2022-01-29 MED ORDER — DOXYCYCLINE HYCLATE 100 MG PO TABS
100.0000 mg | ORAL_TABLET | Freq: Two times a day (BID) | ORAL | 0 refills | Status: AC
Start: 2022-01-29 — End: 2022-02-05

## 2022-01-29 NOTE — Progress Notes (Signed)
Provider: Arieh Bogue FNP-C  Lauree Chandler, NP  Patient Care Team: Lauree Chandler, NP as PCP - General (Geriatric Medicine)  Extended Emergency Contact Information Primary Emergency Contact: [facility] Address: [address]          [place]  [address] Home Phone: [phone] Relation: Spouse  Code Status:  Full Code Goals of care: Advanced Directive information    01/29/2022    1:24 PM  Advanced Directives  Does Patient Have a Medical Advance Directive? No  Would patient like information on creating a medical advance directive? No - Patient declined     Chief Complaint  Patient presents with   Acute Visit    Patient complains of fatigue, nasal congestion, body aches, chills, and fever.    HPI:  Pt is a 76 y.o. 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 Wife states yesterday Temp was 102.9 went down to 101.2 after tylenol then down to 98.9  Today temp is 99.5   Has also had frequency voiding.Per wife he was confused yesterday.    Past Medical History:  Diagnosis Date   History of colonoscopy    [facility]   History of MRI 04/01/2017   Hypertension    Wears glasses    Past Surgical History:  Procedure Laterality Date   BIOPSY PROSTATE  05/11/2021   INGUINAL HERNIA REPAIR Right 04/01/1965   Dr Vassie Moment. Richardson per [facility] new patient packet    INGUINAL HERNIA REPAIR Left 04/02/1991   [facility], Per [facility] New Patient Packet     No Known Allergies  Outpatient Encounter Medications as of  01/29/2022  Medication Sig   amLODipine (NORVASC) 10 MG tablet TAKE 1 TABLET(10 MG) BY MOUTH DAILY   Cyanocobalamin (B-12 PO) Take 1 tablet by mouth daily.   dapagliflozin propanediol (FARXIGA) 5 MG TABS tablet Take 1 tablet (5 mg total) by mouth daily before breakfast.   diphenhydrAMINE (BENADRYL) 25 MG tablet Take 25 mg by mouth as needed.   ELDERBERRY PO Take 50 mg by mouth as needed.   finasteride (PROSCAR) 5 MG tablet Take 5 mg by mouth daily.   losartan (COZAAR) 100 MG tablet TAKE 1 TABLET(100 MG) BY MOUTH DAILY   Metamucil Fiber CHEW Chew by mouth daily.   metFORMIN (GLUCOPHAGE) 500 MG tablet To start 500 mg by mouth daily at breakfast x 7 days then increase to twice daily with breakfast and supper x7 days then increase to 1000 mg at breaskfast and 500 mg at supper x 7 days then 1000 mg by mouth twice daily with breakfast and supper   milk thistle 175 MG tablet Take 175 mg by mouth daily.   Multiple Vitamins-Minerals (OCUVITE PO) Take 1 capsule by mouth daily.    Nutritional Supplements (KIDNEY) CAPS Take 2 capsules by mouth daily.   Omega-3 Krill Oil 500 MG CAPS Take 500 mg by mouth daily.   omeprazole (PRILOSEC) 20 MG capsule Take 20 mg by mouth as needed.   Phenylephrine-Acetaminophen 5-325 MG TABS Take 2 tablets by mouth as needed.   rosuvastatin (CRESTOR) 10 MG tablet TAKE 1 TABLET(10 MG)  BY MOUTH DAILY   sitaGLIPtin (JANUVIA) 100 MG tablet TAKE 1/2 TABLET BY MOUTH EVERY DAY   tamsulosin (FLOMAX) 0.4 MG CAPS capsule TAKE 1 CAPSULE(0.4 MG) BY MOUTH DAILY   No facility-administered encounter medications on file as of 01/29/2022.    Review of Systems  Constitutional:  Positive for chills, fatigue and fever. Negative for appetite change and unexpected weight change.       Generalized body aches   HENT:  Positive for congestion and rhinorrhea. Negative for ear discharge, ear pain, hearing loss, nosebleeds, postnasal drip, sinus pressure, sinus pain, sneezing, sore throat, tinnitus  and trouble swallowing.   Eyes:  Negative for pain, discharge, redness, itching and visual disturbance.  Respiratory:  Negative for cough, chest tightness, shortness of breath and wheezing.   Cardiovascular:  Negative for chest pain, palpitations and leg swelling.  Gastrointestinal:  Negative for abdominal distention, abdominal pain, blood in stool, constipation, diarrhea, nausea and vomiting.  Endocrine: Negative for cold intolerance, heat intolerance, polydipsia, polyphagia and polyuria.  Genitourinary:  Positive for frequency. Negative for difficulty urinating, dysuria, flank pain and urgency.  Musculoskeletal:  Negative for arthralgias, back pain, gait problem, joint swelling, myalgias, neck pain and neck stiffness.  Skin:  Negative for color change, pallor and rash.  Neurological:  Negative for dizziness, syncope, speech difficulty, weakness, light-headedness, numbness and headaches.  Hematological:  Does not bruise/bleed easily.  Psychiatric/Behavioral:  Negative for agitation, behavioral problems, confusion, hallucinations, self-injury, sleep disturbance and suicidal ideas. The patient is not nervous/anxious.     Immunization History  Administered Date(s) Administered   Fluad Quad(high Dose 65+) 12/29/2019, 02/19/2021, 12/07/2021   PFIZER(Purple Top)SARS-COV-2 Vaccination 04/23/2019, 05/14/2019, 01/27/2020   Pneumococcal Conjugate-13 11/10/2020   Pneumococcal Polysaccharide-23 04/01/2017   Zoster Recombinat (Shingrix) 09/02/2019   Pertinent  Health Maintenance Due  Topic Date Due   OPHTHALMOLOGY EXAM  12/07/2020   HEMOGLOBIN A1C  06/07/2022   FOOT EXAM  12/08/2022   INFLUENZA VACCINE  Completed   COLONOSCOPY (Pts 45-86yrs Insurance coverage will need to be confirmed)  Discontinued      02/19/2021   11:37 AM 03/15/2021    9:26 AM 05/25/2021    1:21 PM 12/07/2021    9:40 AM 01/29/2022    1:24 PM  Fall Risk  Falls in the past year? 1  1 0 0  Was there an injury with Fall? 0 0  0 0 0  Fall Risk Category Calculator 1  1 0 0  Fall Risk Category Low  Low Low Low  Patient Fall Risk Level Low fall risk Low fall risk Low fall risk Low fall risk Low fall risk  Patient at Risk for Falls Due to No Fall Risks No Fall Risks No Fall Risks No Fall Risks No Fall Risks  Fall risk Follow up Falls evaluation completed Falls evaluation completed Falls evaluation completed Falls evaluation completed Falls evaluation completed   Functional Status Survey:    Vitals:   01/29/22 1318  BP: 126/70  Pulse: 99  Resp: 18  Temp: 99.5 F (37.5 C)  SpO2: 95%  Weight: 281 lb 3.2 oz (127.6 kg)  Height: 6' (1.829 m)   Body mass index is 38.14 kg/m. Physical Exam Vitals reviewed.  Constitutional:      General: He is not in acute distress.    Appearance: Normal appearance. He is normal weight. He is not ill-appearing or diaphoretic.  HENT:     Head: Normocephalic.     Right Ear: Tympanic membrane, ear canal and external  ear normal. There is no impacted cerumen.     Left Ear: Tympanic membrane, ear canal and external ear normal. There is no impacted cerumen.     Nose: Congestion present. No rhinorrhea.     Mouth/Throat:     Mouth: Mucous membranes are moist.     Pharynx: Oropharynx is clear. No oropharyngeal exudate or posterior oropharyngeal erythema.  Eyes:     General: No scleral icterus.       Right eye: No discharge.        Left eye: No discharge.     Extraocular Movements: Extraocular movements intact.     Conjunctiva/sclera: Conjunctivae normal.     Pupils: Pupils are equal, round, and reactive to light.  Neck:     Vascular: No carotid bruit.  Cardiovascular:     Rate and Rhythm: Normal rate and regular rhythm.     Pulses: Normal pulses.     Heart sounds: Normal heart sounds. No murmur heard.    No friction rub. No gallop.  Pulmonary:     Effort: Pulmonary effort is normal. No respiratory distress.     Breath sounds: Normal breath sounds. No wheezing, rhonchi or  rales.  Chest:     Chest wall: No tenderness.  Abdominal:     General: Bowel sounds are normal. There is no distension.     Palpations: Abdomen is soft. There is no mass.     Tenderness: There is no abdominal tenderness. There is no right CVA tenderness, left CVA tenderness, guarding or rebound.  Musculoskeletal:        General: No swelling or tenderness. Normal range of motion.     Cervical back: Normal range of motion. No rigidity or tenderness.     Right lower leg: No edema.     Left lower leg: No edema.  Lymphadenopathy:     Cervical: No cervical adenopathy.  Skin:    General: Skin is warm and dry.     Coloration: Skin is not pale.     Findings: No bruising, erythema, lesion or rash.  Neurological:     Mental Status: He is alert and oriented to person, place, and time.     Cranial Nerves: No cranial nerve deficit.     Sensory: No sensory deficit.     Motor: No weakness.     Coordination: Coordination normal.     Gait: Gait normal.  Psychiatric:        Mood and Affect: Mood normal.        Speech: Speech normal.        Behavior: Behavior normal.     Labs reviewed: Recent Labs    02/19/21 1209 05/23/21 1053 12/07/21 0959  NA 140 137 138  K 4.1 4.6 4.2  CL 102 102 103  CO2 28 28 23   GLUCOSE 176* 174* 129*  BUN 14 14 15   CREATININE 1.22 1.25 1.41*  CALCIUM 9.6 9.7 9.3   Recent Labs    02/19/21 1209 12/07/21 0959  AST 13 14  ALT 11 10  BILITOT 0.4 0.5  PROT 7.3 7.2   Recent Labs    12/07/21 0959  WBC 6.8  NEUTROABS 3,856  HGB 12.7*  HCT 39.5  MCV 81.3  PLT 266   No results found for: "TSH" Lab Results  Component Value Date   HGBA1C 6.6 (H) 12/07/2021   Lab Results  Component Value Date   CHOL 121 12/07/2021   HDL 36 (L) 12/07/2021   LDLCALC 67 12/07/2021  TRIG 96 12/07/2021   CHOLHDL 3.4 12/07/2021    Significant Diagnostic Results in last 30 days:  No results found.  Assessment/Plan 1. Fever and chills Temp 99.5  - continue on  Tylenol as needed for body aches or fever  - POC Influenza A/B - POC COVID-19  2. Nasal congestion Suspect sinusitis  Strt on Doxycycline 100 mg twice daily x 7 days  - POC Influenza A/B - POC COVID-19  3. Body aches Encouraged fluids. - POC Influenza A/B - POC COVID-19  4. Fatigue, unspecified type Unclear etiology but suspect due to ongoing fever  - POC Influenza A/B are negative  - POC COVID-19 results negative   Family/ staff Communication: Reviewed plan of care with patient verbalized understanding   Labs/tests ordered: - POC Influenza A/B - POC COVID-19  Next Appointment: Return if symptoms worsen or fail to improve.    Caesar Bookman, NP

## 2022-01-29 NOTE — Patient Instructions (Signed)
Notify provider if symptoms worsen or fil to improve

## 2022-01-31 ENCOUNTER — Telehealth: Payer: Self-pay

## 2022-01-31 ENCOUNTER — Other Ambulatory Visit: Payer: Self-pay | Admitting: Family

## 2022-01-31 DIAGNOSIS — R0981 Nasal congestion: Secondary | ICD-10-CM

## 2022-01-31 LAB — CBC WITH DIFFERENTIAL/PLATELET
Absolute Monocytes: 1356 cells/uL — ABNORMAL HIGH (ref 200–950)
Basophils Absolute: 15 cells/uL (ref 0–200)
Basophils Relative: 0.1 %
Eosinophils Absolute: 45 cells/uL (ref 15–500)
Eosinophils Relative: 0.3 %
HCT: 40 % (ref 38.5–50.0)
Hemoglobin: 13.2 g/dL (ref 13.2–17.1)
Lymphs Abs: 2161 cells/uL (ref 850–3900)
MCH: 26.3 pg — ABNORMAL LOW (ref 27.0–33.0)
MCHC: 33 g/dL (ref 32.0–36.0)
MCV: 79.7 fL — ABNORMAL LOW (ref 80.0–100.0)
MPV: 9.9 fL (ref 7.5–12.5)
Monocytes Relative: 9.1 %
Neutro Abs: 11324 cells/uL — ABNORMAL HIGH (ref 1500–7800)
Neutrophils Relative %: 76 %
Platelets: 265 10*3/uL (ref 140–400)
RBC: 5.02 10*6/uL (ref 4.20–5.80)
RDW: 14.3 % (ref 11.0–15.0)
Total Lymphocyte: 14.5 %
WBC: 14.9 10*3/uL — ABNORMAL HIGH (ref 3.8–10.8)

## 2022-01-31 LAB — URINE CULTURE
MICRO NUMBER:: 14128088
Result:: NO GROWTH
SPECIMEN QUALITY:: ADEQUATE

## 2022-01-31 NOTE — Telephone Encounter (Signed)
Message left on clinical intake voicemail:   Need a referral order to a doctor (I was unable to make out the name of the doctor).  Left message on voicemail for patient to return call when available

## 2022-03-18 ENCOUNTER — Encounter: Payer: Medicare Other | Admitting: Nurse Practitioner

## 2022-03-19 ENCOUNTER — Encounter: Payer: Self-pay | Admitting: Nurse Practitioner

## 2022-03-19 ENCOUNTER — Telehealth: Payer: Self-pay

## 2022-03-19 ENCOUNTER — Ambulatory Visit (INDEPENDENT_AMBULATORY_CARE_PROVIDER_SITE_OTHER): Payer: Medicare Other | Admitting: Nurse Practitioner

## 2022-03-19 DIAGNOSIS — Z0000 Encounter for general adult medical examination without abnormal findings: Secondary | ICD-10-CM | POA: Diagnosis not present

## 2022-03-19 NOTE — Telephone Encounter (Signed)
[name], huge are scheduled for a virtual visit with your provider today.    Just as we do with appointments in the office, we must obtain your consent to participate.  Your consent will be active for this visit and any virtual visit you may have with one of our providers in the next 365 days.    If you have a MyChart account, I can also send a copy of this consent to you electronically.  All virtual visits are billed to your insurance company just like a traditional visit in the office.  As this is a virtual visit, video technology does not allow for your provider to perform a traditional examination.  This may limit your provider's ability to fully assess your condition.  If your provider identifies any concerns that need to be evaluated in person or the need to arrange testing such as labs, EKG, etc, we will make arrangements to do so.    Although advances in technology are sophisticated, we cannot ensure that it will always work on either your end or our end.  If the connection with a video visit is poor, we may have to switch to a telephone visit.  With either a video or telephone visit, we are not always able to ensure that we have a secure connection.   I need to obtain your verbal consent now.   Are you willing to proceed with your visit today?   Darren Fisher has provided verbal consent on 03/19/2022 for a virtual visit (video or telephone).   Elveria Royals, CMA 03/19/2022  10:27 AM

## 2022-03-19 NOTE — Patient Instructions (Addendum)
Darren Fisher , Thank you for taking time to come for your Medicare Wellness Visit. I appreciate your ongoing commitment to your health goals. Please review the following plan we discussed and let me know if I can assist you in the future.   Screening recommendations/referrals: Colonoscopy aged out Recommended yearly ophthalmology/optometry visit for glaucoma screening and checkup Recommended yearly dental visit for hygiene and checkup  Vaccinations: Influenza vaccine due annually in September/October Pneumococcal vaccine up to date Tdap vaccine DUE- recommend to get at your local pharmacy Shingles vaccine DUE- recommend to get at your local pharmacy for 2nd vaccine- to check with pharmacy    Advanced directives: recommended to complete  Conditions/risks identified: advanced age, obesity, diabetes  Next appointment: yearly  Preventive Care 58 Years and Older, Male Preventive care refers to lifestyle choices and visits with your health care provider that can promote health and wellness. What does preventive care include? A yearly physical exam. This is also called an annual well check. Dental exams once or twice a year. Routine eye exams. Ask your health care provider how often you should have your eyes checked. Personal lifestyle choices, including: Daily care of your teeth and gums. Regular physical activity. Eating a healthy diet. Avoiding tobacco and drug use. Limiting alcohol use. Practicing safe sex. Taking low doses of aspirin every day. Taking vitamin and mineral supplements as recommended by your health care provider. What happens during an annual well check? The services and screenings done by your health care provider during your annual well check will depend on your age, overall health, lifestyle risk factors, and family history of disease. Counseling  Your health care provider may ask you questions about your: Alcohol use. Tobacco use. Drug use. Emotional  well-being. Home and relationship well-being. Sexual activity. Eating habits. History of falls. Memory and ability to understand (cognition). Work and work Astronomer. Screening  You may have the following tests or measurements: Height, weight, and BMI. Blood pressure. Lipid and cholesterol levels. These may be checked every 5 years, or more frequently if you are over 68 years old. Skin check. Lung cancer screening. You may have this screening every year starting at age 37 if you have a 30-pack-year history of smoking and currently smoke or have quit within the past 15 years. Fecal occult blood test (FOBT) of the stool. You may have this test every year starting at age 35. Flexible sigmoidoscopy or colonoscopy. You may have a sigmoidoscopy every 5 years or a colonoscopy every 10 years starting at age 49. Prostate cancer screening. Recommendations will vary depending on your family history and other risks. Hepatitis C blood test. Hepatitis B blood test. Sexually transmitted disease (STD) testing. Diabetes screening. This is done by checking your blood sugar (glucose) after you have not eaten for a while (fasting). You may have this done every 1-3 years. Abdominal aortic aneurysm (AAA) screening. You may need this if you are a current or former smoker. Osteoporosis. You may be screened starting at age 66 if you are at high risk. Talk with your health care provider about your test results, treatment options, and if necessary, the need for more tests. Vaccines  Your health care provider may recommend certain vaccines, such as: Influenza vaccine. This is recommended every year. Tetanus, diphtheria, and acellular pertussis (Tdap, Td) vaccine. You may need a Td booster every 10 years. Zoster vaccine. You may need this after age 68. Pneumococcal 13-valent conjugate (PCV13) vaccine. One dose is recommended after age 3. Pneumococcal polysaccharide (PPSV23)  vaccine. One dose is recommended after  age 4. Talk to your health care provider about which screenings and vaccines you need and how often you need them. This information is not intended to replace advice given to you by your health care provider. Make sure you discuss any questions you have with your health care provider. Document Released: 04/14/2015 Document Revised: 12/06/2015 Document Reviewed: 01/17/2015 Elsevier Interactive Patient Education  2017 [employer] Prevention in the Home Falls can cause injuries. They can happen to people of all ages. There are many things you can do to make your home safe and to help prevent falls. What can I do on the outside of my home? Regularly fix the edges of walkways and driveways and fix any cracks. Remove anything that might make you trip as you walk through a door, such as a raised step or threshold. Trim any bushes or trees on the path to your home. Use bright outdoor lighting. Clear any walking paths of anything that might make someone trip, such as rocks or tools. Regularly check to see if handrails are loose or broken. Make sure that both sides of any steps have handrails. Any raised decks and porches should have guardrails on the edges. Have any leaves, snow, or ice cleared regularly. Use sand or salt on walking paths during winter. Clean up any spills in your garage right away. This includes oil or grease spills. What can I do in the bathroom? Use night lights. Install grab bars by the toilet and in the tub and shower. Do not use towel bars as grab bars. Use non-skid mats or decals in the tub or shower. If you need to sit down in the shower, use a plastic, non-slip stool. Keep the floor dry. Clean up any water that spills on the floor as soon as it happens. Remove soap buildup in the tub or shower regularly. Attach bath mats securely with double-sided non-slip rug tape. Do not have throw rugs and other things on the floor that can make you trip. What can I do in the  bedroom? Use night lights. Make sure that you have a light by your bed that is easy to reach. Do not use any sheets or blankets that are too big for your bed. They should not hang down onto the floor. Have a firm chair that has side arms. You can use this for support while you get dressed. Do not have throw rugs and other things on the floor that can make you trip. What can I do in the kitchen? Clean up any spills right away. Avoid walking on wet floors. Keep items that you use a lot in easy-to-reach places. If you need to reach something above you, use a strong step stool that has a grab bar. Keep electrical cords out of the way. Do not use floor polish or wax that makes floors slippery. If you must use wax, use non-skid floor wax. Do not have throw rugs and other things on the floor that can make you trip. What can I do with my stairs? Do not leave any items on the stairs. Make sure that there are handrails on both sides of the stairs and use them. Fix handrails that are broken or loose. Make sure that handrails are as long as the stairways. Check any carpeting to make sure that it is firmly attached to the stairs. Fix any carpet that is loose or worn. Avoid having throw rugs at the top or bottom  of the stairs. If you do have throw rugs, attach them to the floor with carpet tape. Make sure that you have a light switch at the top of the stairs and the bottom of the stairs. If you do not have them, ask someone to add them for you. What else can I do to help prevent falls? Wear shoes that: Do not have high heels. Have rubber bottoms. Are comfortable and fit you well. Are closed at the toe. Do not wear sandals. If you use a stepladder: Make sure that it is fully opened. Do not climb a closed stepladder. Make sure that both sides of the stepladder are locked into place. Ask someone to hold it for you, if possible. Clearly mark and make sure that you can see: Any grab bars or  handrails. First and last steps. Where the edge of each step is. Use tools that help you move around (mobility aids) if they are needed. These include: Canes. Walkers. Scooters. Crutches. Turn on the lights when you go into a dark area. Replace any light bulbs as soon as they burn out. Set up your furniture so you have a clear path. Avoid moving your furniture around. If any of your floors are uneven, fix them. If there are any pets around you, be aware of where they are. Review your medicines with your doctor. Some medicines can make you feel dizzy. This can increase your chance of falling. Ask your doctor what other things that you can do to help prevent falls. This information is not intended to replace advice given to you by your health care provider. Make sure you discuss any questions you have with your health care provider. Document Released: 01/12/2009 Document Revised: 08/24/2015 Document Reviewed: 04/22/2014 Elsevier Interactive Patient Education  2017 [employer].

## 2022-03-19 NOTE — Progress Notes (Signed)
Subjective:   Gregoire Bennis is a 76 y.o. male who presents for Medicare Annual/Subsequent preventive examination.  Review of Systems     Cardiac Risk Factors include: obesity (BMI >30kg/m2);advanced age (>47mn, >>40women);diabetes mellitus;hypertension;dyslipidemia;male gender     Objective:    There were no vitals filed for this visit. There is no height or weight on file to calculate BMI.     03/19/2022    9:56 AM 01/29/2022    1:24 PM 12/07/2021    9:40 AM 05/25/2021    1:22 PM 02/19/2021   11:38 AM 11/22/2020   11:33 AM 06/28/2020    1:18 PM  Advanced Directives  Does Patient Have a Medical Advance Directive? _0  No No  Would patient like information on creating a medical advance directive?  No - Patient declined No - Patient declined No - Patient declined Yes (MAU/Ambulatory/Procedural Areas - Information given) Yes (MAU/Ambulatory/Procedural Areas - Information given)     Current Medications (verified) Outpatient Encounter Medications as of 03/19/2022  Medication Sig   amLODipine (NORVASC) 10 MG tablet TAKE 1 TABLET(10 MG) BY MOUTH DAILY   Cyanocobalamin (B-12 PO) Take 1 tablet by mouth daily.   dapagliflozin propanediol (FARXIGA) 5 MG TABS tablet Take 1 tablet (5 mg total) by mouth daily before breakfast.   diphenhydrAMINE (BENADRYL) 25 MG tablet Take 25 mg by mouth as needed.   ELDERBERRY PO Take 50 mg by mouth as needed.   finasteride (PROSCAR) 5 MG tablet Take 5 mg by mouth daily.   losartan (COZAAR) 100 MG tablet TAKE 1 TABLET(100 MG) BY MOUTH DAILY   Metamucil Fiber CHEW Chew by mouth daily.   metFORMIN (GLUCOPHAGE) 500 MG tablet To start 500 mg by mouth daily at breakfast x 7 days then increase to twice daily with breakfast and supper x7 days then increase to 1000 mg at breaskfast and 500 mg at supper x 7 days then 1000 mg by mouth twice daily with breakfast and supper   milk thistle 175 MG tablet Take 175 mg by mouth daily.   Multiple  Vitamins-Minerals (OCUVITE PO) Take 1 capsule by mouth daily.    Nutritional Supplements (KIDNEY) CAPS Take 2 capsules by mouth daily.   Omega-3 Krill Oil 500 MG CAPS Take 500 mg by mouth daily.   omeprazole (PRILOSEC) 20 MG capsule Take 20 mg by mouth as needed.   Phenylephrine-Acetaminophen 5-325 MG TABS Take 2 tablets by mouth as needed.   rosuvastatin (CRESTOR) 10 MG tablet TAKE 1 TABLET(10 MG) BY MOUTH DAILY   sitaGLIPtin (JANUVIA) 100 MG tablet TAKE 1/2 TABLET BY MOUTH EVERY DAY   tamsulosin (FLOMAX) 0.4 MG CAPS capsule TAKE 1 CAPSULE(0.4 MG) BY MOUTH DAILY   No facility-administered encounter medications on file as of 03/19/2022.    Allergies (verified) Patient has no known allergies.   History: Past Medical History:  Diagnosis Date   [facility]   [facility] of MRI 04/01/2017   Hypertension    Wears glasses    Past Surgical History:  Procedure Laterality Date   BIOPSY PROSTATE  05/11/2021   CATARACT EXTRACTION  07/2021   CATARACT EXTRACTION  10/2021   INGUINAL HERNIA REPAIR Right 04/01/1965   [facility]    [facility]    Family History  Problem Relation Age of Onset   Asthma Father  Other Sister    Diabetes Brother    Congestive Heart Failure Brother    Stomach cancer Sister    Obesity Brother    Social History   Socioeconomic History   Marital status: Married    Spouse name: Not on file   Number of children: Not on file   Years of education: Not on file   Highest education level: Not on file  Occupational History   Not on file  Tobacco Use   Smoking status: Never   Smokeless tobacco: Never  Vaping Use   Vaping Use: Never used  Substance and Sexual Activity   Alcohol use: Not Currently   Drug use: Never   Sexual activity: Not on file  Other Topics Concern   Not on file  Social History  Narrative   Tobacco use, amount per day now: None.   Past tobacco use, amount per day:   How many years did you use tobacco:   Alcohol use (drinks per week): Social ( some wine, some beer )   Diet:   Do you drink/eat things with caffeine: Soda.   Marital status: Married                                 What year were you married? 1969   Do you live in a house, apartment, assisted living, condo, trailer, etc.? House.   Is it one or more stories? Single Floor.   How many persons live in your home? Two.   Do you have pets in your home?( please list) None.   Highest Level of education completed: Masters   Current or past profession: Programmer, multimedia.   Do you exercise? Walking.                                    Type and how often? Daily   Do you have a living will? No   Do you have a DNR form? No                                   If not, do you want to discuss one?   Do you have signed POA/HPOA forms? No                       If so, please bring to you appointment    Do you have any difficulty bathing or dressing yourself? No   Do you have difficulty preparing food or eating? No   Do you have difficulty managing your medications? No   Do you have any difficulty managing your finances? No   Do you have any difficulty affording your medications? No      Social Determinants of Health   Financial Resource Strain: Not on file  Food Insecurity: Not on file  Transportation Needs: Not on file  Physical Activity: Not on file  Stress: Not on file  Social Connections: Not on file    Tobacco Counseling Counseling given: Not Answered   Clinical Intake:                 Diabetic?yes         Activities of Daily Living    03/19/2022   10:39 AM  In your present state of health, do  you have any difficulty performing the following activities:  Hearing? 0  Vision? 0  Difficulty concentrating or making decisions? 0  Walking or climbing stairs? 0  Dressing or bathing? 0   Doing errands, shopping? 0  Preparing Food and eating ? N  Using the Toilet? N  In the past six months, have you accidently leaked urine? N  Do you have problems with loss of bowel control? N  Managing your Medications? N  Managing your Finances? N  Housekeeping or managing your Housekeeping? N    Patient Care Team: Lauree Chandler, NP as PCP - General (Geriatric Medicine)  Indicate any recent Medical Services you may have received from other than [facility] providers in the past year (date may be approximate).     Assessment:   This is a routine wellness examination for [place].  Hearing/Vision screen Hearing Screening - Comments:: Patient has no hearing problems Vision Screening - Comments:: Patient has no vision problems. Patient had last eye exam within past year. [facility]  Dietary issues and exercise activities discussed: Current Exercise Habits: Home exercise routine, Type of exercise: walking, Time (Minutes): 15, Frequency (Times/Week): 5, Weekly Exercise (Minutes/Week): 75   Goals Addressed   None    Depression Screen    03/19/2022   10:18 AM 05/25/2021    1:21 PM 03/15/2021    9:25 AM 02/19/2021   11:37 AM 06/28/2020    1:18 PM  PHQ 2/9 Scores  PHQ - 2 Score 0 0 0 0 0    Fall Risk    03/19/2022   10:19 AM 01/29/2022    1:24 PM 12/07/2021    9:40 AM 05/25/2021    1:21 PM 03/15/2021    9:26 AM  [place] in the past year? 0 0 0 1   Number falls in past yr: 0 0 0 0 0  Injury with Fall? 0 0 0 0 0  Risk for fall due to : _0   Follow up _1     FALL RISK PREVENTION PERTAINING TO THE HOME:  Any stairs in or around the home? Yes  If so, are there any without handrails? No  Home free of loose throw rugs in walkways, pet beds, electrical  cords, etc? Yes  Adequate lighting in your home to reduce risk of falls? Yes   ASSISTIVE DEVICES UTILIZED TO PREVENT FALLS:  Life alert? No  Use of a cane, walker or w/c? No  Grab bars in the bathroom? Yes  Shower chair or bench in shower? Yes  Elevated toilet seat or a handicapped toilet? Yes   TIMED UP AND GO:  Was the test performed? No .    Cognitive Function:        03/19/2022   10:19 AM 03/15/2021    9:27 AM  6CIT Screen  What Year? 0 points 0 points  What month? 0 points 0 points  What time? 0 points 0 points  Count back from 20 0 points 0 points  Months in reverse 0 points 0 points  Repeat phrase 0 points 0 points  Total Score 0 points 0 points    Immunizations Immunization History  Administered Date(s) Administered   Fluad Quad(high Dose 65+) 12/29/2019, 02/19/2021, 12/07/2021   PFIZER(Purple Top)SARS-COV-2 Vaccination 04/23/2019, 05/14/2019, 01/27/2020   Pneumococcal Conjugate-13 11/10/2020  Pneumococcal Polysaccharide-23 04/01/2017   Zoster Recombinat (Shingrix) 09/02/2019    TDAP status: Due, Education has been provided regarding the importance of this vaccine. Advised may receive this vaccine at local pharmacy or Health Dept. Aware to provide a copy of the vaccination record if obtained from local pharmacy or Health Dept. Verbalized acceptance and understanding.  Flu Vaccine status: Up to date  Pneumococcal vaccine status: Up to date  Covid-19 vaccine status: Information provided on how to obtain vaccines.   Qualifies for Shingles Vaccine? Yes   Zostavax completed Yes   Shingrix Completed?: No.    Education has been provided regarding the importance of this vaccine. Patient has been advised to call insurance company to determine out of pocket expense if they have not yet received this vaccine. Advised may also receive vaccine at local pharmacy or Health Dept. Verbalized acceptance and understanding.  Screening Tests Health Maintenance  Topic Date  Due   Diabetic kidney evaluation - Urine ACR  Never done   DTaP/Tdap/Td (1 - Tdap) Never done   Zoster Vaccines- Shingrix (2 of 2) 10/28/2019   OPHTHALMOLOGY EXAM  12/07/2020   COVID-19 Vaccine (4 - 2023-24 season) 11/30/2021   HEMOGLOBIN A1C  06/07/2022   Diabetic kidney evaluation - eGFR measurement  12/08/2022   FOOT EXAM  12/08/2022   Medicare Annual Wellness (AWV)  03/20/2023   Pneumonia Vaccine 72+ Years old  Completed   INFLUENZA VACCINE  Completed   Hepatitis C Screening  Completed   HPV VACCINES  Aged Out   COLONOSCOPY (Pts 45-76yr Insurance coverage will need to be confirmed)  Discontinued    Health Maintenance  Health Maintenance Due  Topic Date Due   Diabetic kidney evaluation - Urine ACR  Never done   DTaP/Tdap/Td (1 - Tdap) Never done   Zoster Vaccines- Shingrix (2 of 2) 10/28/2019   OPHTHALMOLOGY EXAM  12/07/2020   COVID-19 Vaccine (4 - 2023-24 season) 11/30/2021    Colorectal cancer screening: No longer required.   Lung Cancer Screening: (Low Dose CT Chest recommended if Age 77-80years, 30 pack-year currently smoking OR have quit w/in 15years.) does not qualify.   Lung Cancer Screening Referral: na  Additional Screening:  Hepatitis C Screening: does qualify; Completed 2021  Vision Screening: Recommended annual ophthalmology exams for early detection of glaucoma and other disorders of the eye. Is the patient up to date with their annual eye exam?  Yes  Who is the provider or what is the name of the office in which the patient attends annual eye exams? [facility] If pt is not established with a provider, would they like to be referred to a provider to establish care? No .   Dental Screening: Recommended annual dental exams for proper oral hygiene  Community Resource Referral / Chronic Care Management: CRR required this visit?  No   CCM required this visit?  No      Plan:     I have personally reviewed and noted the following in the patient's chart:    Medical and social history Use of alcohol, tobacco or illicit drugs  Current medications and supplements including opioid prescriptions. Patient is not currently taking opioid prescriptions. Functional ability and status Nutritional status Physical activity Advanced directives List of other physicians Hospitalizations, surgeries, and ER visits in previous 12 months Vitals Screenings to include cognitive, depression, and falls Referrals and appointments  In addition, I have reviewed and discussed with patient certain preventive protocols, quality metrics, and best practice recommendations. A written personalized  care plan for preventive services as well as general preventive health recommendations were provided to patient.     Lauree Chandler, NP   03/19/2022    Virtual Visit via Telephone Note  I connected with patient 03/19/22 at 10:20 AM EST by telephone and verified that I am speaking with the correct person using two identifiers.  Location: Patient: home Provider: [facility]   I discussed the limitations, risks, security and privacy concerns of performing an evaluation and management service by telephone and the availability of in person appointments. I also discussed with the patient that there may be a patient responsible charge related to this service. The patient expressed understanding and agreed to proceed.   I discussed the assessment and treatment plan with the patient. The patient was provided an opportunity to ask questions and all were answered. The patient agreed with the plan and demonstrated an understanding of the instructions.   The patient was advised to call back or seek an in-person evaluation if the symptoms worsen or if the condition fails to improve as anticipated.  I provided 15 minutes of non-face-to-face time during this encounter.  Carlos American. Harle Battiest [employer] printed and mailed

## 2022-03-19 NOTE — Progress Notes (Signed)
This service is provided via telemedicine  No vital signs collected/recorded due to the encounter was a telemedicine visit.   Location of patient (ex: home, work):  Home  Patient consents to a telephone visit:  Yes, see encounter dated 03/19/2022  Location of the provider (ex: office, home):  [facility]  Name of any referring provider:  N/A  Names of all persons participating in the telemedicine service and their role in the encounter:  Abbey Chatters, Nurse Practitioner, Elveria Royals, CMA, and patient.   Time spent on call:  10 minutes with medical assistant

## 2022-03-21 ENCOUNTER — Other Ambulatory Visit: Payer: Self-pay | Admitting: Nurse Practitioner

## 2022-04-08 ENCOUNTER — Ambulatory Visit: Payer: Medicare Other | Admitting: Nurse Practitioner

## 2022-04-12 ENCOUNTER — Ambulatory Visit: Payer: Medicare Other | Admitting: Nurse Practitioner

## 2022-04-15 ENCOUNTER — Encounter: Payer: Self-pay | Admitting: Nurse Practitioner

## 2022-04-15 ENCOUNTER — Ambulatory Visit (INDEPENDENT_AMBULATORY_CARE_PROVIDER_SITE_OTHER): Payer: Medicare Other | Admitting: Nurse Practitioner

## 2022-04-15 VITALS — BP 162/90 | HR 97 | Temp 98.0°F | Resp 17 | Ht 73.0 in | Wt 280.1 lb

## 2022-04-15 DIAGNOSIS — E782 Mixed hyperlipidemia: Secondary | ICD-10-CM

## 2022-04-15 DIAGNOSIS — E1140 Type 2 diabetes mellitus with diabetic neuropathy, unspecified: Secondary | ICD-10-CM

## 2022-04-15 DIAGNOSIS — I10 Essential (primary) hypertension: Secondary | ICD-10-CM | POA: Diagnosis not present

## 2022-04-15 DIAGNOSIS — R21 Rash and other nonspecific skin eruption: Secondary | ICD-10-CM

## 2022-04-15 DIAGNOSIS — E1122 Type 2 diabetes mellitus with diabetic chronic kidney disease: Secondary | ICD-10-CM | POA: Diagnosis not present

## 2022-04-15 DIAGNOSIS — N183 Chronic kidney disease, stage 3 unspecified: Secondary | ICD-10-CM

## 2022-04-15 MED ORDER — TRIAMCINOLONE ACETONIDE 0.1 % EX CREA: 1.0000 | TOPICAL_CREAM | Freq: Two times a day (BID) | CUTANEOUS | 0 refills | Status: DC

## 2022-04-15 NOTE — Progress Notes (Signed)
Careteam: Patient Care Team: Lauree Chandler, NP as PCP - General (Geriatric Medicine)  PLACE OF SERVICE:  [facility] Directive information Does Patient Have a Medical Advance Directive?: No, Would patient like information on creating a medical advance directive?: No - Patient declined  No Known Allergies  Chief Complaint  Patient presents with   Medical Management of Chronic Issues    4 month follow-up and wants to discuss a rash on his back. Discuss need for diabetic kidney evaluation, td/tdap, shingrix, eye exam and additional covid boosters or post pone if patient refuses or is not a candidate. [facility] verified.      HPI: Patient is a 77 y.o. male for routine follow up.   He has had a faint rash on his back- used benadryl cream which has helped, raised and itchy. ?if that is stress, taking care of son-in-law  and grandson with autism.   He is using flonase for his sinusitis which has improved his breathing.   Continues to follow up with urologist, monitoring due to elevated PSA  DM- On metformin, farixga , and [place]. Does not check blood sugars at home. No signs of hypoglycemia  Htn- amlodipine, losartan-- did not take medication this morning.   Bph- on flomax and proscar, no changes in urinary symptoms.   Gerd- on omeprazole- controlled, avoids trigger foods.   Review of Systems:  Review of Systems  Constitutional:  Negative for chills, fever and weight loss.  HENT:  Negative for tinnitus.   Respiratory:  Negative for cough, sputum production and shortness of breath.   Cardiovascular:  Negative for chest pain, palpitations and leg swelling.  Gastrointestinal:  Negative for abdominal pain, constipation, diarrhea and heartburn.  Genitourinary:  Negative for dysuria, frequency and urgency.  Musculoskeletal:  Negative for back pain, falls, joint pain and myalgias.  Skin:  Positive for itching and rash.  Neurological:  Negative for dizziness and  headaches.  Psychiatric/Behavioral:  Negative for depression and memory loss. The patient does not have insomnia.     Past Medical History:  Diagnosis Date   History of [facility]    [facility]   History of MRI 04/01/2017   Hypertension    Wears glasses    Past Surgical History:  Procedure Laterality Date   BIOPSY PROSTATE  05/11/2021   CATARACT EXTRACTION  07/2021   CATARACT EXTRACTION  10/2021   INGUINAL HERNIA REPAIR Right 04/01/1965   Dr Vassie Moment. [facility]    INGUINAL HERNIA REPAIR Left 04/02/1991   [facility] Packet    Social History:   reports that he has never smoked. He has never used smokeless tobacco. He reports that he does not currently use alcohol. He reports that he does not use drugs.  Family History  Problem Relation Age of Onset   Asthma Father    Other Sister    Diabetes Brother    Congestive Heart Failure Brother    Stomach cancer Sister    Obesity Brother     Medications: Patient's Medications  New Prescriptions   No medications on file  Previous Medications   AMLODIPINE (NORVASC) 10 MG TABLET    TAKE 1 TABLET(10 MG) BY MOUTH DAILY   CYANOCOBALAMIN (B-12 PO)    Take 1 tablet by mouth daily.   DAPAGLIFLOZIN PROPANEDIOL (FARXIGA) 5 MG TABS TABLET    Take 1 tablet (5 mg total) by mouth daily before breakfast.  DIPHENHYDRAMINE (BENADRYL) 25 MG TABLET    Take 25 mg by mouth as needed.   ELDERBERRY PO    Take 50 mg by mouth as needed.   FINASTERIDE (PROSCAR) 5 MG TABLET    Take 5 mg by mouth daily.   FLUTICASONE (FLONASE) 50 MCG/ACT NASAL SPRAY    Place into both nostrils daily.   LOSARTAN (COZAAR) 100 MG TABLET    TAKE 1 TABLET(100 MG) BY MOUTH DAILY   METAMUCIL FIBER CHEW    Chew by mouth daily.   METFORMIN (GLUCOPHAGE) 500 MG TABLET    To start 500 mg by mouth daily at breakfast x 7 days then increase to twice daily with breakfast and supper x7 days then increase to  1000 mg at breaskfast and 500 mg at supper x 7 days then 1000 mg by mouth twice daily with breakfast and supper   MILK THISTLE 175 MG TABLET    Take 175 mg by mouth daily.   MULTIPLE VITAMINS-MINERALS (OCUVITE PO)    Take 1 capsule by mouth daily.    NUTRITIONAL SUPPLEMENTS (KIDNEY) CAPS    Take 2 capsules by mouth daily.   OMEGA-3 KRILL OIL 500 MG CAPS    Take 500 mg by mouth daily.   OMEPRAZOLE (PRILOSEC) 20 MG CAPSULE    Take 20 mg by mouth as needed.   PHENYLEPHRINE-ACETAMINOPHEN 5-325 MG TABS    Take 2 tablets by mouth as needed.   ROSUVASTATIN (CRESTOR) 10 MG TABLET    TAKE 1 TABLET(10 MG) BY MOUTH DAILY   SITAGLIPTIN (JANUVIA) 100 MG TABLET    TAKE 1/2 TABLET BY MOUTH EVERY DAY   TAMSULOSIN (FLOMAX) 0.4 MG CAPS CAPSULE    TAKE 1 CAPSULE(0.4 MG) BY MOUTH DAILY  Modified Medications   No medications on file  Discontinued Medications   No medications on file    Physical Exam:  Vitals:   04/15/22 0937 04/15/22 0953  BP: (!) 162/88 (!) 162/90  Pulse: 97   Resp: 17   Temp: 98 F (36.7 C)   TempSrc: Temporal   SpO2: 98%   Weight: 280 lb 1.6 oz (127.1 kg)   Height: 6\' 1"  (1.854 m)    Body mass index is 36.95 kg/m. Wt Readings from Last 3 Encounters:  04/15/22 280 lb 1.6 oz (127.1 kg)  01/29/22 281 lb 3.2 oz (127.6 kg)  12/07/21 284 lb 6.4 oz (129 kg)    Physical Exam Constitutional:      General: He is not in acute distress.    Appearance: He is well-developed. He is not diaphoretic.  HENT:     Head: Normocephalic and atraumatic.     Right Ear: External ear normal.     Left Ear: External ear normal.     Mouth/Throat:     Pharynx: No oropharyngeal exudate.  Eyes:     Conjunctiva/sclera: Conjunctivae normal.     Pupils: Pupils are equal, round, and reactive to light.  Cardiovascular:     Rate and Rhythm: Normal rate and regular rhythm.     Heart sounds: Normal heart sounds.  Pulmonary:     Effort: Pulmonary effort is normal.     Breath sounds: Normal breath  sounds.  Abdominal:     General: Bowel sounds are normal.     Palpations: Abdomen is soft.  Musculoskeletal:        General: No tenderness.     Cervical back: Normal range of motion and neck supple.     Right lower leg:  No edema.     Left lower leg: No edema.  Skin:    General: Skin is warm and dry.     Findings: Rash (faint rash to low back, small raised bumps noted, no where else on body) present.  Neurological:     Mental Status: He is alert and oriented to person, place, and time.     Labs reviewed: Basic Metabolic Panel: Recent Labs    05/23/21 1053 12/07/21 0959  NA 137 138  K 4.6 4.2  CL 102 103  CO2 28 23  GLUCOSE 174* 129*  BUN 14 15  CREATININE 1.25 1.41*  CALCIUM 9.7 9.3   Liver Function Tests: Recent Labs    12/07/21 0959  AST 14  ALT 10  BILITOT 0.5  PROT 7.2   No results for input(s): "LIPASE", "AMYLASE" in the last 8760 hours. No results for input(s): "AMMONIA" in the last 8760 hours. CBC: Recent Labs    12/07/21 0959 01/29/22 1533  WBC 6.8 14.9*  NEUTROABS 3,856 11,324*  HGB 12.7* 13.2  HCT 39.5 40.0  MCV 81.3 79.7*  PLT 266 265   Lipid Panel: Recent Labs    12/07/21 0959  CHOL 121  HDL 36*  LDLCALC 67  TRIG 96  CHOLHDL 3.4   TSH: No results for input(s): "TSH" in the last 8760 hours. A1C: Lab Results  Component Value Date   HGBA1C 6.6 (H) 12/07/2021     Assessment/Plan 1. Type 2 diabetes mellitus with diabetic neuropathy, without long-term current use of insulin (HCC) -Encouraged dietary compliance, routine foot care/monitoring and to keep up with diabetic eye exams through ophthalmology  -will continue medication, discussed changing medication to help with weight loss.  - Hemoglobin A1c  2. CKD stage 3 secondary to diabetes (HCC) -Chronic and stable Encourage proper hydration Follow metabolic panel Avoid nephrotoxic meds (NSAIDS)  3. Morbid obesity (HCC) --education provided on healthy weight loss through  increase in physical activity and proper nutrition   4. Essential hypertension -Blood pressure well controlled, goal bp <140/90 Continue current medications and dietary modifications follow metabolic panel - COMPLETE METABOLIC PANEL WITH GFR - CBC with Differential/Platelet  5. Mixed hyperlipidemia -continues on crestor with dietary modifications.  - Lipid panel  6. Rash - triamcinolone cream (KENALOG) 0.1 %; Apply 1 Application topically 2 (two) times daily x 1 weeks, notify for worsening symptoms.   Dispense: 30 g; Refill: 0  Return in about 6 months (around 10/14/2022) for routine follow up.  Janene Harvey. [facility] [phone]

## 2022-04-16 LAB — CBC WITH DIFFERENTIAL/PLATELET
Absolute Monocytes: 525 cells/uL (ref 200–950)
Basophils Absolute: 38 cells/uL (ref 0–200)
Basophils Relative: 0.6 %
Eosinophils Absolute: 269 cells/uL (ref 15–500)
Eosinophils Relative: 4.2 %
HCT: 36.6 % — ABNORMAL LOW (ref 38.5–50.0)
Hemoglobin: 11.9 g/dL — ABNORMAL LOW (ref 13.2–17.1)
Lymphs Abs: 1837 cells/uL (ref 850–3900)
MCH: 26 pg — ABNORMAL LOW (ref 27.0–33.0)
MCHC: 32.5 g/dL (ref 32.0–36.0)
MCV: 80.1 fL (ref 80.0–100.0)
MPV: 9.8 fL (ref 7.5–12.5)
Monocytes Relative: 8.2 %
Neutro Abs: 3731 cells/uL (ref 1500–7800)
Neutrophils Relative %: 58.3 %
Platelets: 275 10*3/uL (ref 140–400)
RBC: 4.57 10*6/uL (ref 4.20–5.80)
RDW: 14.3 % (ref 11.0–15.0)
Total Lymphocyte: 28.7 %
WBC: 6.4 10*3/uL (ref 3.8–10.8)

## 2022-04-16 LAB — LIPID PANEL
Cholesterol: 119 mg/dL (ref <200)
HDL: 38 mg/dL — ABNORMAL LOW (ref >=40)
LDL Cholesterol (Calc): 66 mg/dL (calc)
Non-HDL Cholesterol (Calc): 81 mg/dL (calc) (ref <130)
Total CHOL/HDL Ratio: 3.1 (calc) (ref <5.0)
Triglycerides: 70 mg/dL (ref <150)

## 2022-04-16 LAB — COMPLETE METABOLIC PANEL WITH GFR
AG Ratio: 1.6 (calc) (ref 1.0–2.5)
ALT: 12 U/L (ref 9–46)
AST: 16 U/L (ref 10–35)
Albumin: 4.5 g/dL (ref 3.6–5.1)
Alkaline phosphatase (APISO): 74 U/L (ref 35–144)
BUN/Creatinine Ratio: 11 (calc) (ref 6–22)
BUN: 14 mg/dL (ref 7–25)
CO2: 29 mmol/L (ref 20–32)
Calcium: 9.4 mg/dL (ref 8.6–10.3)
Chloride: 103 mmol/L (ref 98–110)
Creat: 1.33 mg/dL — ABNORMAL HIGH (ref 0.70–1.28)
Globulin: 2.9 g/dL (calc) (ref 1.9–3.7)
Glucose, Bld: 130 mg/dL (ref 65–139)
Potassium: 4.4 mmol/L (ref 3.5–5.3)
Sodium: 139 mmol/L (ref 135–146)
Total Bilirubin: 0.4 mg/dL (ref 0.2–1.2)
Total Protein: 7.4 g/dL (ref 6.1–8.1)
eGFR: 55 mL/min/{1.73_m2} — ABNORMAL LOW (ref >=60)

## 2022-04-16 LAB — HEMOGLOBIN A1C
Hgb A1c MFr Bld: 6.7 % of total Hgb — ABNORMAL HIGH (ref <5.7)
Mean Plasma Glucose: 146 mg/dL
eAG (mmol/L): 8.1 mmol/L

## 2022-04-18 ENCOUNTER — Other Ambulatory Visit: Payer: Self-pay

## 2022-04-18 DIAGNOSIS — E1140 Type 2 diabetes mellitus with diabetic neuropathy, unspecified: Secondary | ICD-10-CM

## 2022-04-18 MED ORDER — SEMAGLUTIDE(0.25 OR 0.5MG/DOS) 2 MG/3ML ~~LOC~~ SOPN: 0.2500 mg | PEN_INJECTOR | SUBCUTANEOUS | 3 refills | Status: DC

## 2022-04-21 ENCOUNTER — Other Ambulatory Visit: Payer: Self-pay | Admitting: Nurse Practitioner

## 2022-04-21 DIAGNOSIS — N401 Enlarged prostate with lower urinary tract symptoms: Secondary | ICD-10-CM

## 2022-05-28 ENCOUNTER — Other Ambulatory Visit: Payer: Self-pay | Admitting: *Deleted

## 2022-05-28 MED ORDER — PEN NEEDLES 32G X 4 MM MISC: 0 refills | Status: AC

## 2022-05-28 NOTE — Telephone Encounter (Signed)
Patient is out of town in [place] and ran out of his Pen needles for his Ozempic injection. Requesting Rx to be sent to [facility].   Sent as requested.

## 2022-06-14 LAB — HM DIABETES EYE EXAM

## 2022-06-19 ENCOUNTER — Ambulatory Visit (HOSPITAL_COMMUNITY)
Admission: EM | Admit: 2022-06-19 | Discharge: 2022-06-19 | Disposition: A | Discharge status: Home / Self Care | Payer: Medicare Other | Attending: Emergency Medicine | Admitting: Emergency Medicine

## 2022-06-19 ENCOUNTER — Encounter (HOSPITAL_COMMUNITY): Payer: Self-pay

## 2022-06-19 DIAGNOSIS — R5383 Other fatigue: Secondary | ICD-10-CM | POA: Diagnosis not present

## 2022-06-19 DIAGNOSIS — R21 Rash and other nonspecific skin eruption: Secondary | ICD-10-CM

## 2022-06-19 DIAGNOSIS — E1122 Type 2 diabetes mellitus with diabetic chronic kidney disease: Secondary | ICD-10-CM

## 2022-06-19 DIAGNOSIS — E11628 Type 2 diabetes mellitus with other skin complications: Secondary | ICD-10-CM

## 2022-06-19 DIAGNOSIS — N183 Chronic kidney disease, stage 3 unspecified: Secondary | ICD-10-CM

## 2022-06-19 DIAGNOSIS — L02211 Cutaneous abscess of abdominal wall: Secondary | ICD-10-CM

## 2022-06-19 DIAGNOSIS — Z794 Long term (current) use of insulin: Secondary | ICD-10-CM

## 2022-06-19 LAB — POCT URINALYSIS DIPSTICK, ED / UC
Glucose, UA: NEGATIVE mg/dL
Hgb urine dipstick: NEGATIVE
Ketones, ur: 15 mg/dL — AB
Leukocytes,Ua: NEGATIVE
Nitrite: NEGATIVE
Protein, ur: >=300 mg/dL — AB
Specific Gravity, Urine: >=1.03 (ref 1.005–1.030)
Urobilinogen, UA: 2 mg/dL — ABNORMAL HIGH (ref 0.0–1.0)
pH: 5.5 (ref 5.0–8.0)

## 2022-06-19 LAB — CBG MONITORING, ED: Glucose-Capillary: 109 mg/dL — ABNORMAL HIGH (ref 70–99)

## 2022-06-19 MED ORDER — NYSTATIN 100000 UNIT/GM EX POWD: 1.0000 | Freq: Three times a day (TID) | CUTANEOUS | 0 refills | Status: DC

## 2022-06-19 MED ORDER — DOXYCYCLINE HYCLATE 100 MG PO CAPS: 100.0000 mg | ORAL_CAPSULE | Freq: Two times a day (BID) | ORAL | 0 refills | Status: DC

## 2022-06-19 NOTE — ED Provider Notes (Signed)
Darren Fisher    CSN: [id number] Arrival date & time: 06/19/22  1407      History   Chief Complaint Chief Complaint  Patient presents with   Fatigue   Nausea    HPI [name] is a 77 y.o. male.   Patient reports to clinic for concerns and a very an area that he initially thought was a boil in his right lower abdominal fold.  He has a history of shingles, was concerned that it was shingles.  Has tried some triamcinolone on it, area did not improve.  Reports area is itchy, denies pain, tenderness, drainage or fever.  Wife reports patient has been fatigued recently, patient reports recently visiting his daughter out of state where he contracted a virus.  He also complains of urinary urgency, denies dysuria, odor, flank pain.  He also reports nasal congestion, has previously seen ENT where he trialed Flonase, which helped significantly. Denies sinus pain or pressure.    Does not monitor his blood sugar at home, last A1C 6.7, Cr 1.33 and GFR 55.   O2 98% RA at rest, pulse 98.   The history is provided by the patient and medical records.    Past Medical History:  Diagnosis Date   History of colonoscopy    [facility]   History of MRI 04/01/2017   Hypertension    Wears glasses     Patient Active Problem List   Diagnosis Date Noted   BPH (benign prostatic hyperplasia) 06/29/2020   Essential hypertension 12/29/2019   Mixed hyperlipidemia 12/29/2019   Seasonal allergies 12/29/2019   Type 2 diabetes mellitus with diabetic neuropathy, without long-term current use of insulin ([place]) 12/29/2019   Morbid obesity ([place]) 12/29/2019   Allergy 11/25/2019   Nasal turbinate hypertrophy 11/25/2019    Past Surgical History:  Procedure Laterality Date   BIOPSY PROSTATE  05/11/2021   CATARACT EXTRACTION  07/2021   CATARACT EXTRACTION  10/2021   INGUINAL HERNIA REPAIR Right 04/01/1965   Dr Vassie Moment. [place] per [facility] new patient packet    INGUINAL HERNIA  REPAIR Left 04/02/1991   [facility], Per [facility] New Patient Packet        Home Medications    Prior to Admission medications   Medication Sig Start Date End Date Taking? Authorizing Provider  amLODipine (NORVASC) 10 MG tablet TAKE 1 TABLET(10 MG) BY MOUTH DAILY 03/22/22  Yes Lauree Chandler, NP  Cyanocobalamin (B-12 PO) Take 1 tablet by mouth daily.   Yes [provider]  dapagliflozin propanediol (FARXIGA) 5 MG TABS tablet Take 1 tablet (5 mg total) by mouth daily before breakfast. 11/13/20  Yes Lauree Chandler, NP  diphenhydrAMINE (BENADRYL) 25 MG tablet Take 25 mg by mouth as needed.   Yes [provider]  doxycycline (VIBRAMYCIN) 100 MG capsule Take 1 capsule (100 mg total) by mouth 2 (two) times daily. 06/19/22  Yes Louretta Shorten, [place], FNP  ELDERBERRY PO Take 50 mg by mouth as needed.   Yes [provider]  fluticasone (FLONASE) 50 MCG/ACT nasal spray Place into both nostrils daily.   Yes [provider]  losartan (COZAAR) 100 MG tablet TAKE 1 TABLET(100 MG) BY MOUTH DAILY 12/25/21  Yes Lauree Chandler, NP  Metamucil Fiber CHEW Chew by mouth daily.   Yes [provider]  metFORMIN (GLUCOPHAGE) 500 MG tablet To start 500 mg by mouth daily at breakfast x 7 days then increase to twice daily with breakfast and supper  x7 days then increase to 1000 mg at breaskfast and 500 mg at supper x 7 days then 1000 mg by mouth twice daily with breakfast and supper 05/29/21  Yes Lauree Chandler, NP  milk thistle 175 MG tablet Take 175 mg by mouth daily.   Yes [provider]  Multiple Vitamins-Minerals (OCUVITE PO) Take 1 capsule by mouth daily.    Yes [provider]  Nutritional Supplements (KIDNEY) CAPS Take 2 capsules by mouth daily.   Yes [provider]  nystatin (MYCOSTATIN/NYSTOP) powder Apply 1 Application topically 3 (three) times daily. 06/19/22  Yes Louretta Shorten, [place], FNP  Omega-3 Krill Oil  500 MG CAPS Take 500 mg by mouth daily.   Yes [provider]  omeprazole (PRILOSEC) 20 MG capsule Take 20 mg by mouth as needed.   Yes [provider]  rosuvastatin (CRESTOR) 10 MG tablet TAKE 1 TABLET(10 MG) BY MOUTH DAILY 01/21/22  Yes Lauree Chandler, NP  Semaglutide,0.25 or 0.5MG /DOS, 2 MG/3ML SOPN Inject 0.25 mg into the skin once a week. 04/18/22  Yes Lauree Chandler, NP  tamsulosin (FLOMAX) 0.4 MG CAPS capsule TAKE 1 CAPSULE(0.4 MG) BY MOUTH DAILY 04/22/22  Yes Lauree Chandler, NP  triamcinolone cream (KENALOG) 0.1 % Apply 1 Application topically 2 (two) times daily. 04/15/22  Yes Lauree Chandler, NP  finasteride (PROSCAR) 5 MG tablet Take 5 mg by mouth daily. 05/22/21   [provider]  Insulin Pen Needle (PEN NEEDLES) 32G X 4 MM MISC Use to give injection once weekly. Dx: E11.40 05/28/22   Lauree Chandler, NP    Family History Family History  Problem Relation Age of Onset   Asthma Father    Other Sister    Diabetes Brother    Congestive Heart Failure Brother    Stomach cancer Sister    Obesity Brother     Social History Social History   Tobacco Use   Smoking status: Never   Smokeless tobacco: Never  Vaping Use   Vaping Use: Never used  Substance Use Topics   Alcohol use: Not Currently   Drug use: Never     Allergies   Other   Review of Systems Review of Systems  Constitutional:  Positive for fatigue. Negative for chills and fever.  HENT:  Positive for postnasal drip and rhinorrhea. Negative for ear pain and sore throat.   Eyes:  Negative for pain and visual disturbance.  Respiratory:  Negative for cough and shortness of breath.   Cardiovascular:  Negative for chest pain and palpitations.  Gastrointestinal:  Negative for abdominal pain and vomiting.  Genitourinary:  Negative for dysuria and hematuria.  Musculoskeletal:  Negative for arthralgias and back pain.  Skin:  Positive for rash. Negative for color change.   Neurological:  Negative for seizures and syncope.  All other systems reviewed and are negative.    Physical Exam Triage Vital Signs ED Triage Vitals  Enc Vitals Group     BP 06/19/22 1442 127/67     Pulse Rate 06/19/22 1442 (!) 107     Resp 06/19/22 1442 16     Temp 06/19/22 1442 99.3 F (37.4 C)     Temp Source 06/19/22 1442 Oral     SpO2 06/19/22 1442 93 %     Weight 06/19/22 1442 260 lb (117.9 kg)     Height 06/19/22 1442 6' (1.829 m)     Head Circumference --      Peak Flow --  Pain Score 06/19/22 1438 0     Pain Loc --      Pain Edu? --      Excl. in [place]? --    No data found.  Updated Vital Signs BP 127/67 (BP Location: Right Arm)   Pulse (!) 107   Temp 99.3 F (37.4 C) (Oral)   Resp 16   Ht 6' (1.829 m)   Wt 260 lb (117.9 kg)   SpO2 93%   BMI 35.26 kg/m   Visual Acuity Right Eye Distance:   Left Eye Distance:   Bilateral Distance:    Right Eye Near:   Left Eye Near:    Bilateral Near:     Physical Exam Vitals and nursing note reviewed.  Constitutional:      General: He is not in acute distress.    Appearance: He is well-developed.  HENT:     Head: Normocephalic and atraumatic.     Right Ear: External ear normal.     Left Ear: External ear normal.     Nose: Rhinorrhea present.     Mouth/Throat:     Mouth: Mucous membranes are moist.  Eyes:     Extraocular Movements: Extraocular movements intact.     Conjunctiva/sclera: Conjunctivae normal.     Pupils: Pupils are equal, round, and reactive to light.  Cardiovascular:     Rate and Rhythm: Normal rate and regular rhythm.     Heart sounds: Normal heart sounds. No murmur heard. Pulmonary:     Effort: Pulmonary effort is normal. No respiratory distress.     Breath sounds: Normal breath sounds.     Comments: Lungs vesicular posteriorly. Musculoskeletal:        General: No swelling. Normal range of motion.     Cervical back: Normal range of motion and neck supple.  Skin:    General: Skin is  warm and dry.     Capillary Refill: Capillary refill takes less than 2 seconds.     Findings: Lesion and rash present. Rash is urticarial.       Neurological:     Mental Status: He is alert.  Psychiatric:        Mood and Affect: Mood normal.        Behavior: Behavior is cooperative.      UC Treatments / Results  Labs (all labs ordered are listed, but only abnormal results are displayed) Labs Reviewed  CBG MONITORING, ED - Abnormal; Notable for the following components:      Result Value   Glucose-Capillary 109 (*)    All other components within normal limits  POCT URINALYSIS DIPSTICK, ED / UC - Abnormal; Notable for the following components:   Bilirubin Urine SMALL (*)    Ketones, ur 15 (*)    Protein, ur >=300 (*)    Urobilinogen, UA 2.0 (*)    All other components within normal limits    EKG   Radiology No results found.  Procedures Procedures (including critical care time)  Medications Ordered in UC Medications - No data to display  Initial Impression / Assessment and Plan / UC Course  I have reviewed the triage vital signs and the nursing notes.  Pertinent labs & imaging results that were available during my care of the patient were reviewed by me and considered in my medical decision making (see chart for details).  Vitals in triage reviewed, patient is hemodynamically stable.  Patient afebrile in clinic, without tachycardia on physical exam.  Lungs vesicular.  Reports  a lesion to his right lower abdominal/groin fold.  Area is firm and indurated with surrounding erythremic rash. Suspect furuncles with surrounding yeast, will cover with doxycycline (CKD stage 3) and nystatin powder.   Reports nasal congestion, physical exam with rhinorrhea and postnasal drip.  Advised to restart on Flonase and follow-up with ENT as needed.  As far as urinary urgency, urine without leukocytes, hematuria or nitrites.  Protein present, consistent with previous urinalysis.  CBG 106,  last visit A1C was well-controlled at 6.7. Low concern for DKA or hypoglycemia.  Lungs vesicular, patient without fever or cough, will withhold imaging at this time.  Of note, doxycycline will cover for CAP, and ABS if present.   Advised follow-up with primary care for further evaluation of fatigue and urgency.  Return precautions and emergency precautions discussed, patient verbalized understanding of plan of care, no questions at this time.    Final Clinical Impressions(s) / UC Diagnoses   Final diagnoses:  Rash and nonspecific skin eruption  Other fatigue  Abscess of skin of abdomen  Type 2 diabetes mellitus with stage 3 chronic kidney disease, with long-term current use of insulin, unspecified whether stage 3a or 3b CKD ([place])     Discharge Instructions      Your blood sugar was 106 in clinic, your urine was without signs of infection.  For your skin infection I am prescribing doxycycline, take this twice daily until finished.  You can take this with food to help prevent gastrointestinal upset.  Please do warm soaks to the area 2-3 times daily.  After the soaks, pat dry and apply the nystatin powder.  If the area begins to drain or opens, please use clean hands and gently express the fluid.   Please re-start your Flonase and follow-up with your ENT if nasal congestion persists.  The doxycycline will cover you for bacterial sinusitis or respiratory pathogens as well.  Please follow-up with your primary care for further evaluation if symptoms continue.  Please seek immediate care if you develop fever, shortness of breath, chest pain, syncope, worsening of infection, or any changes.      ED Prescriptions     Medication Sig Dispense Auth. Provider   nystatin (MYCOSTATIN/NYSTOP) powder Apply 1 Application topically 3 (three) times daily. 15 g Nataniel Gasper, [place], [place]   doxycycline (VIBRAMYCIN) 100 MG capsule Take 1 capsule (100 mg total) by mouth 2 (two) times daily. 20 capsule  Melodee Lupe, [place] N, [place]      PDMP not reviewed this encounter.   Malin Sambrano, [place] N, [place] 06/19/22 [phone]

## 2022-06-19 NOTE — Discharge Instructions (Addendum)
Your blood sugar was 106 in clinic, your urine was without signs of infection.  For your skin infection I am prescribing doxycycline, take this twice daily until finished.  You can take this with food to help prevent gastrointestinal upset.  Please do warm soaks to the area 2-3 times daily.  After the soaks, pat dry and apply the nystatin powder.  If the area begins to drain or opens, please use clean hands and gently express the fluid.   Please re-start your Flonase and follow-up with your ENT if nasal congestion persists.  The doxycycline will cover you for bacterial sinusitis or respiratory pathogens as well.  Please follow-up with your primary care for further evaluation if symptoms continue.  Please seek immediate care if you develop fever, shortness of breath, chest pain, syncope, worsening of infection, or any changes.

## 2022-06-19 NOTE — ED Triage Notes (Signed)
Patient having fatigue, nausea, runny nose onset 1.5 weeks. Patient has a round red spot on the RLQ for a week that itches. No drainage. Slight hand tremors.

## 2022-07-05 ENCOUNTER — Encounter: Payer: Self-pay | Admitting: Nurse Practitioner

## 2022-07-05 ENCOUNTER — Ambulatory Visit (INDEPENDENT_AMBULATORY_CARE_PROVIDER_SITE_OTHER): Payer: Medicare Other | Admitting: Nurse Practitioner

## 2022-07-05 VITALS — BP 138/82 | HR 89 | Temp 98.1°F | Resp 16 | Ht 72.0 in | Wt 273.2 lb

## 2022-07-05 DIAGNOSIS — L089 Local infection of the skin and subcutaneous tissue, unspecified: Secondary | ICD-10-CM | POA: Diagnosis not present

## 2022-07-05 DIAGNOSIS — E1140 Type 2 diabetes mellitus with diabetic neuropathy, unspecified: Secondary | ICD-10-CM

## 2022-07-05 DIAGNOSIS — D229 Melanocytic nevi, unspecified: Secondary | ICD-10-CM

## 2022-07-05 DIAGNOSIS — J0140 Acute pansinusitis, unspecified: Secondary | ICD-10-CM | POA: Diagnosis not present

## 2022-07-05 NOTE — Patient Instructions (Signed)
Netipot/saline wash daily (in the evening) Plain nasal saline spray throughout the day as needed Avoid forcefully blowing nose To continue claritin daily

## 2022-07-05 NOTE — Progress Notes (Signed)
Careteam: Patient Care Team: Sharon Seller, NP as PCP - General (Geriatric Medicine)  PLACE OF SERVICE:  [facility]  Advanced Directive information    Allergies  Allergen Reactions   Other Rash    Seafood     Chief Complaint  Patient presents with   Spot on stomach    Was given pill and powder for spot on stomach that was infected when he saw urgent care. Would like the spot rechecked.  He thinks th queasy is from sinuses.      HPI: Patient is a 77 y.o. male due to sinus build up.  He went to urgent care.  Dr Jenne Pane gave him flonase which helps but sometimes the sinuses flare up.  When he gets sick gets very weak.  Was given doxycyline 100 mg BID for 10 days. He is feeling better.   Also had a spot on his abdomen- dx with abscess with doxycycline also covered.  Area looking much better. Not red at this time.   Review of Systems:  Review of Systems  Constitutional:  Negative for chills, fever and weight loss.  HENT:  Positive for congestion. Negative for tinnitus.   Respiratory:  Negative for cough, sputum production and shortness of breath.   Cardiovascular:  Negative for chest pain, palpitations and leg swelling.  Gastrointestinal:  Negative for abdominal pain, constipation, diarrhea and heartburn.  Genitourinary:  Negative for dysuria, frequency and urgency.  Musculoskeletal:  Negative for back pain, falls, joint pain and myalgias.  Skin: Negative.   Neurological:  Negative for dizziness and headaches.   Past Medical History:  Diagnosis Date   History of colonoscopy    [facility]   History of MRI 04/01/2017   Hypertension    Wears glasses    Past Surgical History:  Procedure Laterality Date   BIOPSY PROSTATE  05/11/2021   CATARACT EXTRACTION  07/2021   CATARACT EXTRACTION  10/2021   INGUINAL HERNIA REPAIR Right 04/01/1965   Dr Nelma Rothman. [place] per [facility] new patient packet    INGUINAL HERNIA REPAIR Left 04/02/1991   [facility], Per [facility] New Patient Packet    Social History:   reports that he has never smoked. He has never used smokeless tobacco. He reports that he does not currently use alcohol. He reports that he does not use drugs.  Family History  Problem Relation Age of Onset   Asthma Father    Other Sister    Diabetes Brother    Congestive Heart Failure Brother    Stomach cancer Sister    Obesity Brother     Medications: Patient's Medications  New Prescriptions   No medications on file  Previous Medications   AMLODIPINE (NORVASC) 10 MG TABLET    TAKE 1 TABLET(10 MG) BY MOUTH DAILY   CYANOCOBALAMIN (B-12 PO)    Take 1 tablet by mouth daily.   DAPAGLIFLOZIN PROPANEDIOL (FARXIGA) 5 MG TABS TABLET    Take 1 tablet (5 mg total) by mouth daily before breakfast.   DIPHENHYDRAMINE (BENADRYL) 25 MG TABLET    Take 25 mg by mouth as needed.   DOXYCYCLINE (VIBRAMYCIN) 100 MG CAPSULE    Take 1 capsule (100 mg total) by mouth 2 (two) times daily.   ELDERBERRY PO    Take 50 mg by mouth as needed.   FINASTERIDE (PROSCAR) 5 MG TABLET    Take 5 mg by mouth daily.   FLUTICASONE (FLONASE) 50 MCG/ACT NASAL SPRAY  Place into both nostrils daily.   INSULIN PEN NEEDLE (PEN NEEDLES) 32G X 4 MM MISC    Use to give injection once weekly. Dx: E11.40   LOSARTAN (COZAAR) 100 MG TABLET    TAKE 1 TABLET(100 MG) BY MOUTH DAILY   METAMUCIL FIBER CHEW    Chew by mouth daily.   METFORMIN (GLUCOPHAGE) 500 MG TABLET    To start 500 mg by mouth daily at breakfast x 7 days then increase to twice daily with breakfast and supper x7 days then increase to 1000 mg at breaskfast and 500 mg at supper x 7 days then 1000 mg by mouth twice daily with breakfast and supper   MILK THISTLE 175 MG TABLET    Take 175 mg by mouth daily.   MULTIPLE VITAMINS-MINERALS (OCUVITE PO)    Take 1 capsule by mouth daily.    NUTRITIONAL SUPPLEMENTS (KIDNEY) CAPS    Take 2 capsules by mouth daily.   NYSTATIN (MYCOSTATIN/NYSTOP) POWDER    Apply 1  Application topically 3 (three) times daily.   OMEGA-3 KRILL OIL 500 MG CAPS    Take 500 mg by mouth daily.   OMEPRAZOLE (PRILOSEC) 20 MG CAPSULE    Take 20 mg by mouth as needed.   ROSUVASTATIN (CRESTOR) 10 MG TABLET    TAKE 1 TABLET(10 MG) BY MOUTH DAILY   SEMAGLUTIDE,0.25 OR 0.5MG /DOS, 2 MG/3ML SOPN    Inject 0.25 mg into the skin once a week.   TAMSULOSIN (FLOMAX) 0.4 MG CAPS CAPSULE    TAKE 1 CAPSULE(0.4 MG) BY MOUTH DAILY   TRIAMCINOLONE CREAM (KENALOG) 0.1 %    Apply 1 Application topically 2 (two) times daily.  Modified Medications   No medications on file  Discontinued Medications   No medications on file    Physical Exam:  Vitals:   07/05/22 1048  BP: 138/82  Pulse: 89  Resp: 16  Temp: 98.1 F (36.7 C)  TempSrc: Temporal  SpO2: 98%  Weight: 273 lb 3.2 oz (123.9 kg)  Height: 6' (1.829 m)   Body mass index is 37.05 kg/m. Wt Readings from Last 3 Encounters:  07/05/22 273 lb 3.2 oz (123.9 kg)  06/19/22 260 lb (117.9 kg)  04/15/22 280 lb 1.6 oz (127.1 kg)    Physical Exam Constitutional:      General: He is not in acute distress.    Appearance: He is well-developed. He is not diaphoretic.  HENT:     Head: Normocephalic and atraumatic.     Right Ear: Tympanic membrane, ear canal and external ear normal.     Left Ear: Tympanic membrane, ear canal and external ear normal.     Nose: Congestion and rhinorrhea present.     Mouth/Throat:     Pharynx: No oropharyngeal exudate.  Eyes:     Conjunctiva/sclera: Conjunctivae normal.     Pupils: Pupils are equal, round, and reactive to light.  Cardiovascular:     Rate and Rhythm: Normal rate and regular rhythm.     Heart sounds: Normal heart sounds.  Pulmonary:     Effort: Pulmonary effort is normal.     Breath sounds: Normal breath sounds.  Abdominal:     General: Bowel sounds are normal.     Palpations: Abdomen is soft.  Musculoskeletal:        General: No tenderness.     Cervical back: Normal range of motion and  neck supple.     Right lower leg: No edema.     Left lower  leg: No edema.  Skin:    General: Skin is warm and dry.  Neurological:     Mental Status: He is alert and oriented to person, place, and time.     Labs reviewed: Basic Metabolic Panel: Recent Labs    12/07/21 0959 04/15/22 1021  NA 138 139  K 4.2 4.4  CL 103 103  CO2 23 29  GLUCOSE 129* 130  BUN 15 14  CREATININE 1.41* 1.33*  CALCIUM 9.3 9.4   Liver Function Tests: Recent Labs    12/07/21 0959 04/15/22 1021  AST 14 16  ALT 10 12  BILITOT 0.5 0.4  PROT 7.2 7.4   No results for input(s): "LIPASE", "AMYLASE" in the last 8760 hours. No results for input(s): "AMMONIA" in the last 8760 hours. CBC: Recent Labs    12/07/21 0959 01/29/22 1533 04/15/22 1021  WBC 6.8 14.9* 6.4  NEUTROABS 3,856 11,324* 3,731  HGB 12.7* 13.2 11.9*  HCT 39.5 40.0 36.6*  MCV 81.3 79.7* 80.1  PLT 266 265 275   Lipid Panel: Recent Labs    12/07/21 0959 04/15/22 1021  CHOL 121 119  HDL 36* 38*  LDLCALC 67 66  TRIG 96 70  CHOLHDL 3.4 3.1   TSH: No results for input(s): "TSH" in the last 8760 hours. A1C: Lab Results  Component Value Date   HGBA1C 6.7 (H) 04/15/2022     Assessment/Plan 1. Skin infection -has improved with doxycycline, to continue to monitor for signs of infection  2. Acute non-recurrent pansinusitis -improved at this time, following with ENT -Netipot or saline wash daily Plain nasal saline spray throughout the day as needed -educated not to forcefully blow nose -continue flonase  3. Type 2 diabetes mellitus with diabetic neuropathy, without long-term current use of insulin -Encouraged dietary compliance, routine foot care/monitoring and to keep up with diabetic eye exams through ophthalmology  -continues on semaglutide- had had a hard time getting Rx filled (out of stock at pharmacy)  4. Numerous moles Request referral.  - Ambulatory referral to Dermatology   Hae Ahlers K. Biagio BorgEubanks,  AGNP [facility] [phone]

## 2022-07-10 IMAGING — MR MR PROSTATE WO/W CM
12 series · 48 of 48 positions shown · IV contrast (20 ml multihance)
Comparison: None.

CLINICAL DATA: Elevated PSA level of 7.15 on 10/31/2020.

EXAM:
MR PROSTATE WITHOUT AND WITH CONTRAST
TECHNIQUE: Multiplanar multisequence MRI images were obtained of the pelvis
centered about the prostate. Pre and post contrast images were
obtained.
CONTRAST:  20mL MULTIHANCE GADOBENATE DIMEGLUMINE 529 MG/ML IV SOLN

[Series 4: T2 · coronal · 3.0mm · 0.56mm/px · 1 of 27 slices shown (1 of 3)]
[im 1/27]
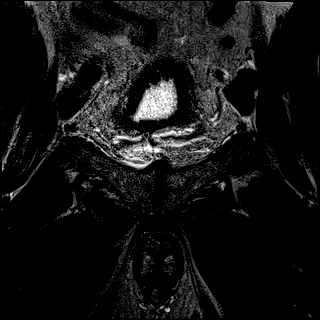

[Series 5: T1 · axial · 5.0mm · 1.25mm/px · 1 of 80 slices shown]
[im 1/80]
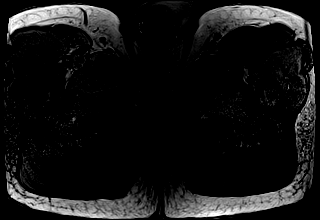

[Series 6: DWI · axial · 3.0mm · 1.75mm/px · 1 of 84 slices shown (1 of 3)]
[im 1/84]
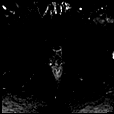

[Series 7: DWI · axial · 3.0mm · 1.75mm/px · 1 of 28 slices shown (2 of 3)]
[im 1/28]
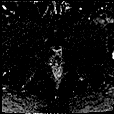

[Series 8: DWI · axial · 3.0mm · 1.75mm/px · 1 of 27 slices shown (3 of 3)]
[im 1/27]
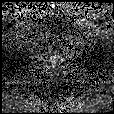

[Series 9: T2 · axial · 3.0mm · 0.56mm/px · 1 of 27 slices shown (2 of 3)]
[im 1/27]
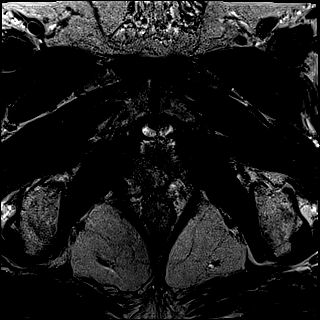

[Series 10: T2 · axial · 1.0mm · 1.04mm/px · z∈[+29,+116]mm · 2 of 88 slices shown (3 of 3)]
[im 1/88]
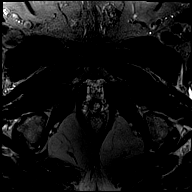
[im 88/88]
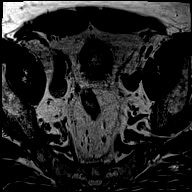

[Series 11: pre t1_twist_tra_dyn · axial · non-contrast · 3.5mm · 0.83mm/px · 1 of 26 slices shown]
[im 1/26]
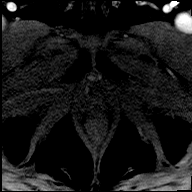

[Series 12: post t1_twist_tra_dyn-copy center · axial · non-contrast · 3.5mm · 0.83mm/px · z∈[+28,+116]mm · 18 of 780 slices shown]
[im 1/780]
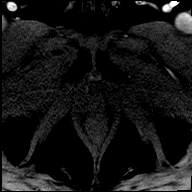
[im 46/780]
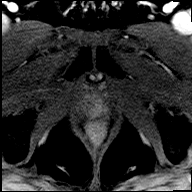
[im 92/780]
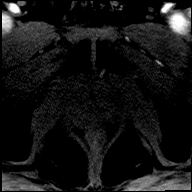
[im 138/780]
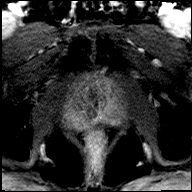
[im 184/780]
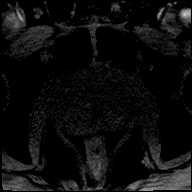
[im 230/780]
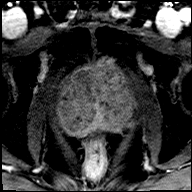
[im 275/780]
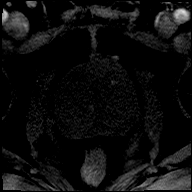
[im 321/780]
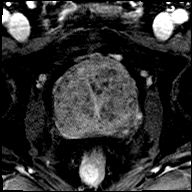
[im 367/780]
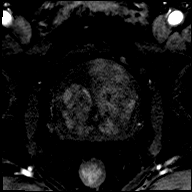
[im 413/780]
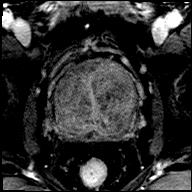
[im 459/780]
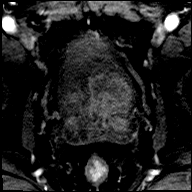
[im 505/780]
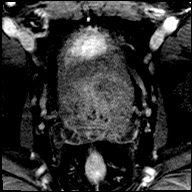
[im 550/780]
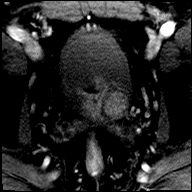
[im 596/780]
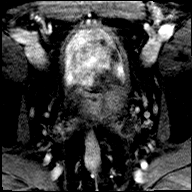
[im 642/780]
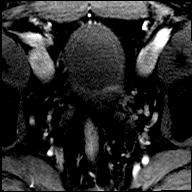
[im 688/780]
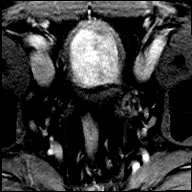
[im 734/780]
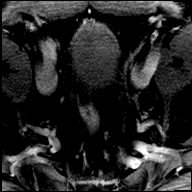
[im 780/780]
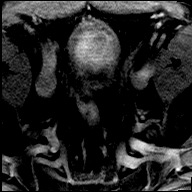

[Series 13: post t1_twist_tra_dyn-copy cent_sub · axial · 3.5mm · 0.83mm/px · z∈[+28,+116]mm · 17 of 754 slices shown]
[im 1/754]
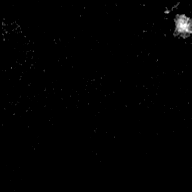
[im 48/754]
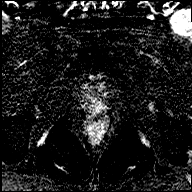
[im 95/754]
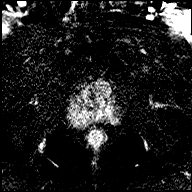
[im 142/754]
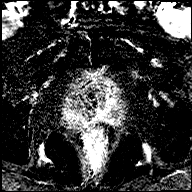
[im 189/754]
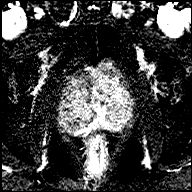
[im 236/754]
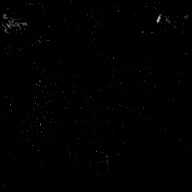
[im 283/754]
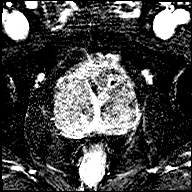
[im 330/754]
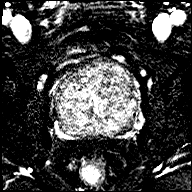
[im 377/754]
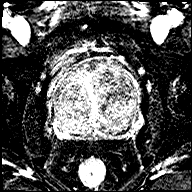
[im 424/754]
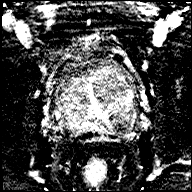
[im 471/754]
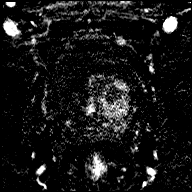
[im 518/754]
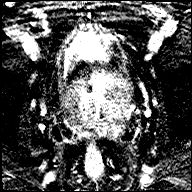
[im 565/754]
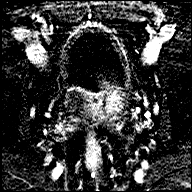
[im 612/754]
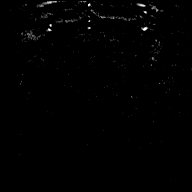
[im 659/754]
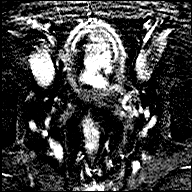
[im 706/754]
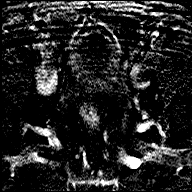
[im 754/754]
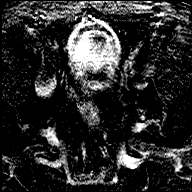

[Series 14: t1_vibe_dixon_tra_f · axial · 2.5mm · 0.91mm/px · z∈[-13,+185]mm · 2 of 80 slices shown]
[im 1/80]
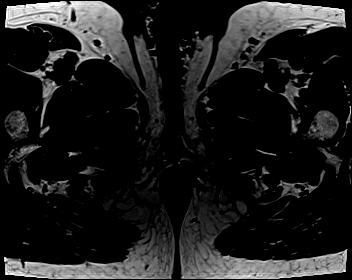
[im 80/80]
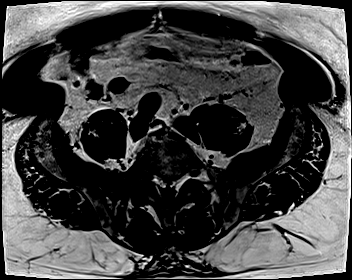

[Series 15: t1_vibe_dixon_tra_w · axial · 2.5mm · 0.91mm/px · z∈[-13,+185]mm · 2 of 80 slices shown]
[im 1/80]
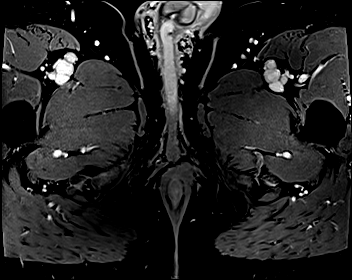
[im 80/80]
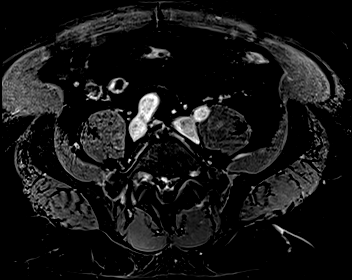

[48 of 48 positions shown; findings below may reference images not displayed]

FINDINGS: Prostate:

Region of interest # 1: PI-RADS category 4 lesion of the right
posterolateral peripheral zone at the apex, with focally reduced T2
signal and focal early enhancement. This measures 0.36 cc (1.4 by
0.4 by 0.7 cm) and is shown for example on image 77 series 10.

Thinning of the peripheral zone in general with low T2 signal
nonfocal stranding in the peripheral zone, likely postinflammatory
and considered PI-RADS category 2.

Encapsulated nodularity in the transition zone pedicle with benign
prostatic hypertrophy.

Volume: 3D volumetric analysis: Prostate volume 171.67 cc (7.2 by
6.9 by 6.9 cm).

Transcapsular spread:  Absent

Seminal vesicle involvement: Absent

Neurovascular bundle involvement: Absent

Pelvic adenopathy: Absent

Bone metastasis: Absent

Other findings: Small rim enhancing lesion in the left iliac bone
with internal high precontrast T1 signal on image 23 series 15 and
image 52 of series [DATE], compatible with benign process.
IMPRESSION: 1. Small PI-RADS category 4 lesion of the right posterolateral
peripheral zone at the apex. Targeting data sent to [facility].
2. Prostatomegaly and benign prostatic hypertrophy.

## 2022-07-19 ENCOUNTER — Ambulatory Visit: Payer: Medicare Other | Admitting: Nurse Practitioner

## 2022-07-20 ENCOUNTER — Other Ambulatory Visit: Payer: Self-pay | Admitting: Nurse Practitioner

## 2022-08-06 ENCOUNTER — Other Ambulatory Visit: Payer: Self-pay

## 2022-08-06 DIAGNOSIS — E1140 Type 2 diabetes mellitus with diabetic neuropathy, unspecified: Secondary | ICD-10-CM

## 2022-08-06 MED ORDER — SEMAGLUTIDE(0.25 OR 0.5MG/DOS) 2 MG/3ML ~~LOC~~ SOPN: 0.2500 mg | PEN_INJECTOR | SUBCUTANEOUS | 3 refills | Status: DC

## 2022-08-06 NOTE — Telephone Encounter (Signed)
Patient called requesting that refill for ozempic be sent to [facility] in [place]. He is in [place] visiting his daughter and ran out of medication.  Medication has been pended and sent to Abbey Chatters, NP for approval.

## 2022-09-04 ENCOUNTER — Other Ambulatory Visit: Payer: Self-pay | Admitting: Nurse Practitioner

## 2022-09-04 NOTE — Telephone Encounter (Signed)
High Risk Warning Populated when attempting to refill, I will send to Provider for further review 

## 2022-09-05 ENCOUNTER — Other Ambulatory Visit: Payer: Self-pay | Admitting: Nurse Practitioner

## 2022-09-05 DIAGNOSIS — E1140 Type 2 diabetes mellitus with diabetic neuropathy, unspecified: Secondary | ICD-10-CM

## 2022-10-25 ENCOUNTER — Encounter: Payer: Medicare Other | Admitting: Nurse Practitioner

## 2022-10-25 ENCOUNTER — Encounter: Payer: Self-pay | Admitting: Nurse Practitioner

## 2022-10-25 NOTE — Progress Notes (Signed)
Error

## 2022-11-08 ENCOUNTER — Ambulatory Visit (INDEPENDENT_AMBULATORY_CARE_PROVIDER_SITE_OTHER): Payer: Medicare Other | Admitting: Adult Health

## 2022-11-08 ENCOUNTER — Encounter: Payer: Self-pay | Admitting: Adult Health

## 2022-11-08 VITALS — BP 135/88 | HR 64 | Temp 97.5°F | Resp 18 | Ht 72.0 in | Wt 274.4 lb

## 2022-11-08 DIAGNOSIS — I10 Essential (primary) hypertension: Secondary | ICD-10-CM | POA: Diagnosis not present

## 2022-11-08 DIAGNOSIS — E669 Obesity, unspecified: Secondary | ICD-10-CM | POA: Diagnosis not present

## 2022-11-08 DIAGNOSIS — Z6837 Body mass index (BMI) 37.0-37.9, adult: Secondary | ICD-10-CM

## 2022-11-08 DIAGNOSIS — E1122 Type 2 diabetes mellitus with diabetic chronic kidney disease: Secondary | ICD-10-CM

## 2022-11-08 DIAGNOSIS — N183 Chronic kidney disease, stage 3 unspecified: Secondary | ICD-10-CM

## 2022-11-08 DIAGNOSIS — N401 Enlarged prostate with lower urinary tract symptoms: Secondary | ICD-10-CM | POA: Diagnosis not present

## 2022-11-08 DIAGNOSIS — E1140 Type 2 diabetes mellitus with diabetic neuropathy, unspecified: Secondary | ICD-10-CM | POA: Diagnosis not present

## 2022-11-08 DIAGNOSIS — R351 Nocturia: Secondary | ICD-10-CM

## 2022-11-08 MED ORDER — LANCET DEVICE MISC: 1.0000 | Freq: Three times a day (TID) | 3 refills | Status: AC

## 2022-11-08 MED ORDER — BLOOD GLUCOSE TEST VI STRP: 1.0000 | ORAL_STRIP | Freq: Three times a day (TID) | 3 refills | Status: AC

## 2022-11-08 MED ORDER — LANCETS MISC. MISC: 1.0000 | Freq: Three times a day (TID) | 3 refills | Status: AC

## 2022-11-08 MED ORDER — BLOOD GLUCOSE MONITORING SUPPL DEVI: 1.0000 | Freq: Three times a day (TID) | 0 refills | Status: AC

## 2022-11-08 NOTE — Progress Notes (Signed)
[facility] clinic  Provider:  Kenard Gower DNP  Code Status:  Full Code  Goals of Care:     11/08/2022    3:39 PM  Advanced Directives  Does Patient Have a Medical Advance Directive? No  Would patient like information on creating a medical advance directive? No - Patient declined     Chief Complaint  Patient presents with   Acute Visit    Discuss medications    HPI: Patient is a 77 y.o. Fisher seen today for an acute visit to discuss medications. He stated that he always get a hassle when he tries to get Ozempic from the pharmacy. Noted that he still has 3 refills of Ozempic. H takes Ozempic, Metformin and Farxiga for diabetes mellitus. He does not check his blood sugar at home. Latest A1C 6.7, 04/15/22.   Benign prostatic hyperplasia with nocturia -  gets up 2-3X/night to urinate, takes Finasteride and Fosamax  Essential hypertension -  BP 135/88, takes Losartan   Wt Readings from Last 3 Encounters:  11/08/22 274 lb 6.4 oz (124.5 kg)  07/05/22 273 lb 3.2 oz (123.9 kg)  06/19/22 260 lb (117.9 kg)     Past Medical History:  Diagnosis Date   History of colonoscopy    [facility] [place] [facility]   History of MRI 04/01/2017   Hypertension    Wears glasses     Past Surgical History:  Procedure Laterality Date   BIOPSY PROSTATE  05/11/2021   CATARACT EXTRACTION  07/2021   CATARACT EXTRACTION  10/2021   INGUINAL HERNIA REPAIR Right 04/01/1965   Dr Nelma Rothman. [place] per [facility] new patient packet    INGUINAL HERNIA REPAIR Left 04/02/1991   [facility], Per [facility] New Patient Packet     Allergies  Allergen Reactions   Other Rash    Seafood     Outpatient Encounter Medications as of 11/08/2022  Medication Sig   amLODipine (NORVASC) 10 MG tablet TAKE 1 TABLET(10 MG) BY MOUTH DAILY   Blood Glucose Monitoring Suppl DEVI 1 each by Does not apply route in the morning, at noon, and at bedtime. May substitute to any manufacturer covered by patient's insurance.    Cyanocobalamin (B-12 PO) Take 1 tablet by mouth daily.   dapagliflozin propanediol (FARXIGA) 5 MG TABS tablet Take 1 tablet (5 mg total) by mouth daily before breakfast.   diphenhydrAMINE (BENADRYL) 25 MG tablet Take 25 mg by mouth as needed.   doxycycline (VIBRAMYCIN) 100 MG capsule Take 1 capsule (100 mg total) by mouth 2 (two) times daily.   ELDERBERRY PO Take 50 mg by mouth as needed.   finasteride (PROSCAR) 5 MG tablet Take 5 mg by mouth daily.   fluticasone (FLONASE) 50 MCG/ACT nasal spray Place into both nostrils daily.   Glucose Blood (BLOOD GLUCOSE TEST STRIPS) STRP 1 each by In Vitro route in the morning, at noon, and at bedtime. May substitute to any manufacturer covered by patient's insurance.   Insulin Pen Needle (PEN NEEDLES) 32G X 4 MM MISC Use to give injection once weekly. Dx: E11.40   Lancet Device MISC 1 each by Does not apply route in the morning, at noon, and at bedtime. May substitute to any manufacturer covered by patient's insurance.   Lancets Misc. MISC 1 each by Does not apply route in the morning, at noon, and at bedtime. May substitute to any manufacturer covered by patient's insurance.   losartan (COZAAR) 100 MG tablet TAKE 1 TABLET(100 MG) BY MOUTH DAILY  Metamucil Fiber CHEW Chew by mouth daily.   metFORMIN (GLUCOPHAGE) 1000 MG tablet Take 1 tablet (1,000 mg total) by mouth 2 (two) times daily with a meal.   milk thistle 175 MG tablet Take 175 mg by mouth daily.   Multiple Vitamins-Minerals (OCUVITE PO) Take 1 capsule by mouth daily.    Nutritional Supplements (KIDNEY) CAPS Take 2 capsules by mouth daily.   nystatin (MYCOSTATIN/NYSTOP) powder Apply 1 Application topically 3 (three) times daily.   Omega-3 Krill Oil 500 MG CAPS Take 500 mg by mouth daily.   omeprazole (PRILOSEC) 20 MG capsule Take 20 mg by mouth as needed.   OZEMPIC, 0.25 OR 0.5 MG/DOSE, 2 MG/3ML SOPN INJECT 0.25 MG UNDER THE SKIN ONCE A WEEK   rosuvastatin (CRESTOR) 10 MG tablet TAKE 1 TABLET(10  MG) BY MOUTH DAILY   tamsulosin (FLOMAX) 0.4 MG CAPS capsule TAKE 1 CAPSULE(0.4 MG) BY MOUTH DAILY   triamcinolone cream (KENALOG) 0.1 % Apply 1 Application topically 2 (two) times daily.   No facility-administered encounter medications on file as of 11/08/2022.    Review of Systems:  Review of Systems  Constitutional:  Negative for activity change, appetite change and fever.  HENT:  Negative for sore throat.   Eyes: Negative.   Cardiovascular:  Negative for chest pain and leg swelling.  Gastrointestinal:  Negative for abdominal distention, diarrhea and vomiting.  Genitourinary:  Negative for dysuria, frequency and urgency.  Skin:  Negative for color change.  Neurological:  Negative for dizziness and headaches.  Psychiatric/Behavioral:  Negative for behavioral problems and sleep disturbance. The patient is not nervous/anxious.     Health Maintenance  Topic Date Due   Diabetic kidney evaluation - Urine ACR  Never done   DTaP/Tdap/Td (1 - Tdap) Never done   Zoster Vaccines- Shingrix (2 of 2) 10/28/2019   COVID-19 Vaccine (4 - 2023-24 season) 11/30/2021   HEMOGLOBIN A1C  10/14/2022   INFLUENZA VACCINE  10/31/2022   FOOT EXAM  12/08/2022   Medicare Annual Wellness (AWV)  03/20/2023   Diabetic kidney evaluation - eGFR measurement  04/16/2023   OPHTHALMOLOGY EXAM  06/14/2023   Pneumonia Vaccine 2+ Years old  Completed   Hepatitis C Screening  Completed   HPV VACCINES  Aged Out   Colonoscopy  Discontinued    Physical Exam: Vitals:   11/08/22 1530  BP: 135/88  Pulse: 64  Resp: 18  Temp: (!) 97.5 F (36.4 C)  SpO2: 97%  Weight: 274 lb 6.4 oz (124.5 kg)  Height: 6' (1.829 m)   Body mass index is 37.22 kg/m. Physical Exam Constitutional:      Appearance: He is obese.  HENT:     Head: Normocephalic and atraumatic.     Mouth/Throat:     Mouth: Mucous membranes are moist.  Eyes:     Conjunctiva/sclera: Conjunctivae normal.  Cardiovascular:     Rate and Rhythm: Normal  rate and regular rhythm.     Pulses: Normal pulses.     Heart sounds: Normal heart sounds.  Pulmonary:     Effort: Pulmonary effort is normal.     Breath sounds: Normal breath sounds.  Abdominal:     General: Bowel sounds are normal.     Palpations: Abdomen is soft.  Musculoskeletal:        General: No swelling. Normal range of motion.     Cervical back: Normal range of motion.  Skin:    General: Skin is warm and dry.  Neurological:  General: No focal deficit present.     Mental Status: He is alert and oriented to person, place, and time.  Psychiatric:        Mood and Affect: Mood normal.        Behavior: Behavior normal.        Thought Content: Thought content normal.        Judgment: Judgment normal.     Labs reviewed: Basic Metabolic Panel: Recent Labs    12/07/21 0959 04/15/22 1021  NA 138 139  K 4.2 4.4  CL 103 103  CO2 23 29  GLUCOSE 129* 130  BUN 15 14  CREATININE 1.41* 1.Darren*  CALCIUM 9.3 9.4   Liver Function Tests: Recent Labs    12/07/21 0959 04/15/22 1021  AST 14 16  ALT 10 12  BILITOT 0.5 0.4  PROT 7.2 7.4   No results for input(s): "LIPASE", "AMYLASE" in the last 8760 hours. No results for input(s): "AMMONIA" in the last 8760 hours. CBC: Recent Labs    12/07/21 0959 01/29/22 1533 04/15/22 1021  WBC 6.8 14.9* 6.4  NEUTROABS 3,856 11,324* 3,731  HGB 12.7* 13.2 11.9*  HCT 39.5 40.0 36.6*  MCV 81.3 79.7* 80.1  PLT 266 265 275   Lipid Panel: Recent Labs    12/07/21 0959 04/15/22 1021  CHOL 121 119  HDL 36* 38*  LDLCALC 67 66  TRIG 96 70  CHOLHDL 3.4 3.1   Lab Results  Component Value Date   HGBA1C 6.7 (H) 04/15/2022    Procedures since last visit: No results found.  Assessment/Plan  1. Type 2 diabetes mellitus with diabetic neuropathy, without long-term current use of insulin (HCC) Lab Results  Component Value Date   HGBA1C 6.7 (H) 04/15/2022   -  continue current medications -  instructed to check blood sugar at  home, record and bring to next appointment - Blood Glucose Monitoring Suppl DEVI; 1 each by Does not apply route in the morning, at noon, and at bedtime. May substitute to any manufacturer covered by patient's insurance.  Dispense: 1 each; Refill: 0 - Glucose Blood (BLOOD GLUCOSE TEST STRIPS) STRP; 1 each by In Vitro route in the morning, at noon, and at bedtime. May substitute to any manufacturer covered by patient's insurance.  Dispense: 100 strip; Refill: 3 - Lancet Device MISC; 1 each by Does not apply route in the morning, at noon, and at bedtime. May substitute to any manufacturer covered by patient's insurance.  Dispense: 1 each; Refill: 3 - Lancets Misc. MISC; 1 each by Does not apply route in the morning, at noon, and at bedtime. May substitute to any manufacturer covered by patient's insurance.  Dispense: 100 each; Refill: 3  2. Benign prostatic hyperplasia with nocturia -  stable -  continue Finasteride and Fosamax  3. Essential hypertension - BP stable -  continue Losartan  4. Class 2 obesity with body mass index (BMI) of 37.0 to 37.9 in adult, unspecified obesity type, unspecified whether serious comorbidity present Body mass index is 37.22 kg/m. -  instructed to exercise regularly  5. CKD stage 3 secondary to diabetes [facility]) Lab Results  Component Value Date   NA 139 04/15/2022   K 4.4 04/15/2022   CO2 29 04/15/2022   GLUCOSE 130 04/15/2022   BUN 14 04/15/2022   CREATININE 1.Darren (H) 04/15/2022   CALCIUM 9.4 04/15/2022   EGFR 55 (L) 04/15/2022   GFRNONAA 47 (L) 06/28/2020   -  avoid OTC meds such as Ibuprofen  Labs/tests ordered:    None  Next appt:  04/01/2023

## 2022-12-17 LAB — HM DIABETES EYE EXAM

## 2022-12-24 ENCOUNTER — Telehealth: Payer: Medicare Other

## 2022-12-24 NOTE — Telephone Encounter (Signed)
Patient called stating he continues to have issues with getting his rx filled for Ozempic and asked that we consult with the pharmacy to see what action is required, if any on our end.

## 2022-12-27 NOTE — Telephone Encounter (Signed)
[facility] called and stated that patient has picked up his Ozempic on 12/24/2022

## 2022-12-27 NOTE — Telephone Encounter (Signed)
A detailed message was left for the pharmacy requesting that they send a fax or give [place] a call with any action that is required on our end to expedite patient getting rx for Ozempic.

## 2023-01-16 ENCOUNTER — Other Ambulatory Visit: Payer: Self-pay | Admitting: Nurse Practitioner

## 2023-01-16 DIAGNOSIS — E782 Mixed hyperlipidemia: Secondary | ICD-10-CM

## 2023-02-18 LAB — HM DIABETES EYE EXAM

## 2023-02-21 ENCOUNTER — Ambulatory Visit (INDEPENDENT_AMBULATORY_CARE_PROVIDER_SITE_OTHER): Payer: Medicare Other | Admitting: Nurse Practitioner

## 2023-02-21 ENCOUNTER — Encounter: Payer: Self-pay | Admitting: Nurse Practitioner

## 2023-02-21 VITALS — BP 138/78 | HR 95 | Temp 97.7°F | Resp 17 | Ht 72.0 in | Wt 280.0 lb

## 2023-02-21 DIAGNOSIS — I10 Essential (primary) hypertension: Secondary | ICD-10-CM

## 2023-02-21 DIAGNOSIS — E782 Mixed hyperlipidemia: Secondary | ICD-10-CM

## 2023-02-21 DIAGNOSIS — R0683 Snoring: Secondary | ICD-10-CM | POA: Diagnosis not present

## 2023-02-21 DIAGNOSIS — E1140 Type 2 diabetes mellitus with diabetic neuropathy, unspecified: Secondary | ICD-10-CM

## 2023-02-21 DIAGNOSIS — B351 Tinea unguium: Secondary | ICD-10-CM

## 2023-02-21 MED ORDER — TRIAMCINOLONE ACETONIDE 0.1 % EX CREA: 1.0000 | TOPICAL_CREAM | Freq: Two times a day (BID) | CUTANEOUS | 0 refills | Status: AC

## 2023-02-21 MED ORDER — NYSTATIN 100000 UNIT/GM EX POWD: 1.0000 | Freq: Three times a day (TID) | CUTANEOUS | 0 refills | Status: AC

## 2023-02-21 NOTE — Progress Notes (Signed)
Careteam: Patient Care Team: Sharon Seller, NP as PCP - General (Geriatric Medicine)  PLACE OF SERVICE:  [facility]  Advanced Directive information    Allergies  Allergen Reactions   Other Rash    Seafood     Chief Complaint  Patient presents with   Acute Visit    Patient would like to discuss a sleep study referral      HPI: Patient is a 77 y.o. male for acute visit but due to routine management.   Had conjunctival hemorrhage- scared him- went to ophthalmology and everything checked out okay. Now healing.   Wife is scheduled for sleep study on Dec 3rd.  He is snoring.  He is easily fatigued.  Feels like he could have some sleep apnea.   DM- does not prink finger or check blood sugars. Does not want to if he does not have to.  Continues to travel back and forth to [place] to help her son. Son in law had several strokes and brain bleed.    Review of Systems:  Review of Systems  Constitutional:  Negative for chills, fever and weight loss.  HENT:  Negative for tinnitus.   Respiratory:  Negative for cough, sputum production and shortness of breath.   Cardiovascular:  Negative for chest pain, palpitations and leg swelling.  Gastrointestinal:  Negative for abdominal pain, constipation, diarrhea and heartburn.  Genitourinary:  Negative for dysuria, frequency and urgency.  Musculoskeletal:  Negative for back pain, falls, joint pain and myalgias.  Skin: Negative.   Neurological:  Negative for dizziness and headaches.  Psychiatric/Behavioral:  Negative for depression and memory loss. The patient does not have insomnia.     Past Medical History:  Diagnosis Date   History of colonoscopy    [facility]   History of MRI 04/01/2017   Hypertension    Wears glasses    Past Surgical History:  Procedure Laterality Date   BIOPSY PROSTATE  05/11/2021   CATARACT EXTRACTION  07/2021   CATARACT EXTRACTION  10/2021   INGUINAL HERNIA REPAIR Right  04/01/1965   Dr Nelma Rothman. [place] per [facility] new patient packet    INGUINAL HERNIA REPAIR Left 04/02/1991   [facility], Per [facility] New Patient Packet    Social History:   reports that he has never smoked. He has never used smokeless tobacco. He reports that he does not currently use alcohol. He reports that he does not use drugs.  Family History  Problem Relation Age of Onset   Asthma Father    Other Sister    Diabetes Brother    Congestive Heart Failure Brother    Stomach cancer Sister    Obesity Brother     Medications: Patient's Medications  New Prescriptions   No medications on file  Previous Medications   AMLODIPINE (NORVASC) 10 MG TABLET    TAKE 1 TABLET(10 MG) BY MOUTH DAILY   BLOOD GLUCOSE MONITORING SUPPL DEVI    1 each by Does not apply route in the morning, at noon, and at bedtime. May substitute to any manufacturer covered by patient's insurance.   CYANOCOBALAMIN (B-12 PO)    Take 1 tablet by mouth daily.   ELDERBERRY PO    Take 50 mg by mouth as needed.   FINASTERIDE (PROSCAR) 5 MG TABLET    Take 5 mg by mouth daily.   FLUTICASONE (FLONASE) 50 MCG/ACT NASAL SPRAY    Place into both nostrils daily.   INSULIN PEN NEEDLE (PEN  NEEDLES) 32G X 4 MM MISC    Use to give injection once weekly. Dx: E11.40   LOSARTAN (COZAAR) 100 MG TABLET    TAKE 1 TABLET(100 MG) BY MOUTH DAILY   METAMUCIL FIBER CHEW    Chew by mouth daily.   METFORMIN (GLUCOPHAGE) 1000 MG TABLET    Take 1 tablet (1,000 mg total) by mouth 2 (two) times daily with a meal.   MILK THISTLE 175 MG TABLET    Take 175 mg by mouth daily.   MULTIPLE VITAMINS-MINERALS (OCUVITE PO)    Take 1 capsule by mouth daily.    NUTRITIONAL SUPPLEMENTS (KIDNEY) CAPS    Take 2 capsules by mouth daily.   NYSTATIN (MYCOSTATIN/NYSTOP) POWDER    Apply 1 Application topically 3 (three) times daily.   OMEGA-3 KRILL OIL 500 MG CAPS    Take 500 mg by mouth daily.   OMEPRAZOLE (PRILOSEC) 20 MG CAPSULE    Take 20 mg by mouth as  needed.   OZEMPIC, 0.25 OR 0.5 MG/DOSE, 2 MG/3ML SOPN    INJECT 0.25 MG UNDER THE SKIN ONCE A WEEK   ROSUVASTATIN (CRESTOR) 10 MG TABLET    TAKE 1 TABLET(10 MG) BY MOUTH DAILY   TAMSULOSIN (FLOMAX) 0.4 MG CAPS CAPSULE    TAKE 1 CAPSULE(0.4 MG) BY MOUTH DAILY   TRIAMCINOLONE CREAM (KENALOG) 0.1 %    Apply 1 Application topically 2 (two) times daily.  Modified Medications   No medications on file  Discontinued Medications   DAPAGLIFLOZIN PROPANEDIOL (FARXIGA) 5 MG TABS TABLET    Take 1 tablet (5 mg total) by mouth daily before breakfast.   DIPHENHYDRAMINE (BENADRYL) 25 MG TABLET    Take 25 mg by mouth as needed.   DOXYCYCLINE (VIBRAMYCIN) 100 MG CAPSULE    Take 1 capsule (100 mg total) by mouth 2 (two) times daily.    Physical Exam:  Vitals:   02/21/23 1328  BP: 138/78  Pulse: 95  Resp: 17  Temp: 97.7 F (36.5 C)  TempSrc: Temporal  SpO2: 96%  Weight: 280 lb (127 kg)  Height: 6' (1.829 m)   Body mass index is 37.97 kg/m. Wt Readings from Last 3 Encounters:  02/21/23 280 lb (127 kg)  11/08/22 274 lb 6.4 oz (124.5 kg)  07/05/22 273 lb 3.2 oz (123.9 kg)    Physical Exam Constitutional:      General: He is not in acute distress.    Appearance: He is well-developed. He is not diaphoretic.  HENT:     Head: Normocephalic and atraumatic.     Right Ear: External ear normal.     Left Ear: External ear normal.     Mouth/Throat:     Pharynx: No oropharyngeal exudate.  Eyes:     Conjunctiva/sclera: Conjunctivae normal.     Pupils: Pupils are equal, round, and reactive to light.  Cardiovascular:     Rate and Rhythm: Normal rate and regular rhythm.     Heart sounds: Normal heart sounds.  Pulmonary:     Effort: Pulmonary effort is normal.     Breath sounds: Normal breath sounds.  Abdominal:     General: Bowel sounds are normal.     Palpations: Abdomen is soft.  Musculoskeletal:        General: No tenderness.     Cervical back: Normal range of motion and neck supple.      Right lower leg: No edema.     Left lower leg: No edema.  Skin:  General: Skin is warm and dry.  Neurological:     Mental Status: He is alert and oriented to person, place, and time.     Labs reviewed: Basic Metabolic Panel: Recent Labs    04/15/22 1021  NA 139  K 4.4  CL 103  CO2 29  GLUCOSE 130  BUN 14  CREATININE 1.33*  CALCIUM 9.4   Liver Function Tests: Recent Labs    04/15/22 1021  AST 16  ALT 12  BILITOT 0.4  PROT 7.4   No results for input(s): "LIPASE", "AMYLASE" in the last 8760 hours. No results for input(s): "AMMONIA" in the last 8760 hours. CBC: Recent Labs    04/15/22 1021  WBC 6.4  NEUTROABS 3,731  HGB 11.9*  HCT 36.6*  MCV 80.1  PLT 275   Lipid Panel: Recent Labs    04/15/22 1021  CHOL 119  HDL 38*  LDLCALC 66  TRIG 70  CHOLHDL 3.1   TSH: No results for input(s): "TSH" in the last 8760 hours. A1C: Lab Results  Component Value Date   HGBA1C 6.7 (H) 04/15/2022     Assessment/Plan 1. Type 2 diabetes mellitus with diabetic neuropathy, without long-term current use of insulin (HCC) -Encouraged dietary compliance, routine foot care/monitoring and to keep up with diabetic eye exams through ophthalmology  Continue medication regimen - Hemoglobin A1c - Urine Albumin/Creatinine with ratio (send out) [[id number]]  2. Essential hypertension -Blood pressure well controlled, goal bp <140/90 Continue current medications and dietary modifications follow metabolic panel - COMPLETE METABOLIC PANEL WITH GFR - CBC with Differential/Platelet  3. Mixed hyperlipidemia -continue crestor - Lipid panel - COMPLETE METABOLIC PANEL WITH GFR  4. Snores -snores loudly, morbid obesity and daytime fatigue puts him at risk for OSA, will get pulmonary referral for evaluation - Ambulatory referral to Pulmonology  5. Tinea unguium Overgrown, long thick toenails.  - Ambulatory referral to Podiatry for management     Return in about 6 months (around  08/21/2023) for routine follow up .  Janene Harvey. Biagio Borg [facility] [phone]

## 2023-02-22 LAB — CBC WITH DIFFERENTIAL/PLATELET
Absolute Lymphocytes: 1856 {cells}/uL (ref 850–3900)
Absolute Monocytes: 551 {cells}/uL (ref 200–950)
Basophils Absolute: 17 {cells}/uL (ref 0–200)
Basophils Relative: 0.3 %
Eosinophils Absolute: 122 {cells}/uL (ref 15–500)
Eosinophils Relative: 2.1 %
HCT: 39.1 % (ref 38.5–50.0)
Hemoglobin: 12.5 g/dL — ABNORMAL LOW (ref 13.2–17.1)
MCH: 26.3 pg — ABNORMAL LOW (ref 27.0–33.0)
MCHC: 32 g/dL (ref 32.0–36.0)
MCV: 82.1 fL (ref 80.0–100.0)
MPV: 9.8 fL (ref 7.5–12.5)
Monocytes Relative: 9.5 %
Neutro Abs: 3254 {cells}/uL (ref 1500–7800)
Neutrophils Relative %: 56.1 %
Platelets: 280 10*3/uL (ref 140–400)
RBC: 4.76 10*6/uL (ref 4.20–5.80)
RDW: 14.1 % (ref 11.0–15.0)
Total Lymphocyte: 32 %
WBC: 5.8 10*3/uL (ref 3.8–10.8)

## 2023-02-22 LAB — HEMOGLOBIN A1C
Hgb A1c MFr Bld: 6.4 %{Hb} — ABNORMAL HIGH (ref <5.7)
Mean Plasma Glucose: 137 mg/dL
eAG (mmol/L): 7.6 mmol/L

## 2023-02-22 LAB — COMPLETE METABOLIC PANEL WITH GFR
AG Ratio: 1.6 (calc) (ref 1.0–2.5)
ALT: 13 U/L (ref 9–46)
AST: 17 U/L (ref 10–35)
Albumin: 4.6 g/dL (ref 3.6–5.1)
Alkaline phosphatase (APISO): 90 U/L (ref 35–144)
BUN: 16 mg/dL (ref 7–25)
CO2: 28 mmol/L (ref 20–32)
Calcium: 9.7 mg/dL (ref 8.6–10.3)
Chloride: 103 mmol/L (ref 98–110)
Creat: 1.19 mg/dL (ref 0.70–1.28)
Globulin: 2.8 g/dL (ref 1.9–3.7)
Glucose, Bld: 108 mg/dL — ABNORMAL HIGH (ref 65–99)
Potassium: 4.5 mmol/L (ref 3.5–5.3)
Sodium: 139 mmol/L (ref 135–146)
Total Bilirubin: 0.5 mg/dL (ref 0.2–1.2)
Total Protein: 7.4 g/dL (ref 6.1–8.1)
eGFR: 63 mL/min/{1.73_m2} (ref >=60)

## 2023-02-22 LAB — LIPID PANEL
Cholesterol: 117 mg/dL (ref <200)
HDL: 37 mg/dL — ABNORMAL LOW (ref >=40)
LDL Cholesterol (Calc): 63 mg/dL
Non-HDL Cholesterol (Calc): 80 mg/dL (ref <130)
Total CHOL/HDL Ratio: 3.2 (calc) (ref <5.0)
Triglycerides: 90 mg/dL (ref <150)

## 2023-02-26 LAB — MICROALBUMIN / CREATININE URINE RATIO
Creatinine, Urine: 163 mg/dL (ref 20–320)
Microalb Creat Ratio: 22 mg/g{creat} (ref <30)
Microalb, Ur: 3.6 mg/dL

## 2023-03-13 ENCOUNTER — Encounter: Payer: Self-pay | Admitting: Podiatry

## 2023-03-13 ENCOUNTER — Ambulatory Visit (INDEPENDENT_AMBULATORY_CARE_PROVIDER_SITE_OTHER): Payer: Medicare Other | Admitting: Podiatry

## 2023-03-13 DIAGNOSIS — L84 Corns and callosities: Secondary | ICD-10-CM | POA: Diagnosis not present

## 2023-03-13 DIAGNOSIS — M79675 Pain in left toe(s): Secondary | ICD-10-CM | POA: Diagnosis not present

## 2023-03-13 DIAGNOSIS — B351 Tinea unguium: Secondary | ICD-10-CM

## 2023-03-13 DIAGNOSIS — M7661 Achilles tendinitis, right leg: Secondary | ICD-10-CM | POA: Diagnosis not present

## 2023-03-13 DIAGNOSIS — M79674 Pain in right toe(s): Secondary | ICD-10-CM

## 2023-03-13 DIAGNOSIS — E1140 Type 2 diabetes mellitus with diabetic neuropathy, unspecified: Secondary | ICD-10-CM | POA: Diagnosis not present

## 2023-03-13 MED ORDER — METHYLPREDNISOLONE 4 MG PO TBPK: ORAL_TABLET | ORAL | 0 refills | Status: DC

## 2023-03-13 NOTE — Patient Instructions (Signed)
Achilles Tendinitis  with Rehab Achilles tendinitis is a disorder of the Achilles tendon. The Achilles tendon connects the large calf muscles (Gastrocnemius and Soleus) to the heel bone (calcaneus). This tendon is sometimes called the heel cord. It is important for pushing-off and standing on your toes and is important for walking, running, or jumping. Tendinitis is often caused by overuse and repetitive microtrauma. SYMPTOMS Pain, tenderness, swelling, warmth, and redness may occur over the Achilles tendon even at rest. Pain with pushing off, or flexing or extending the ankle. Pain that is worsened after or during activity. CAUSES  Overuse sometimes seen with rapid increase in exercise programs or in sports requiring running and jumping. Poor physical conditioning (strength and flexibility or endurance). Running sports, especially training running down hills. Inadequate warm-up before practice or play or failure to stretch before participation. Injury to the tendon. PREVENTION  Warm up and stretch before practice or competition. Allow time for adequate rest and recovery between practices and competition. Keep up conditioning. Keep up ankle and leg flexibility. Improve or keep muscle strength and endurance. Improve cardiovascular fitness. Use proper technique. Use proper equipment (shoes, skates). To help prevent recurrence, taping, protective strapping, or an adhesive bandage may be recommended for several weeks after healing is complete. PROGNOSIS  Recovery may take weeks to several months to heal. Longer recovery is expected if symptoms have been prolonged. Recovery is usually quicker if the inflammation is due to a direct blow as compared with overuse or sudden strain. RELATED COMPLICATIONS  Healing time will be prolonged if the condition is not correctly treated. The injury must be given plenty of time to heal. Symptoms can reoccur if activity is resumed too soon. Untreated,  tendinitis may increase the risk of tendon rupture requiring additional time for recovery and possibly surgery. TREATMENT  The first treatment consists of rest anti-inflammatory medication, and ice to relieve the pain. Stretching and strengthening exercises after resolution of pain will likely help reduce the risk of recurrence. Referral to a physical therapist or athletic trainer for further evaluation and treatment may be helpful. A walking boot or cast may be recommended to rest the Achilles tendon. This can help break the cycle of inflammation and microtrauma. Arch supports (orthotics) may be prescribed or recommended by your caregiver as an adjunct to therapy and rest. Surgery to remove the inflamed tendon lining or degenerated tendon tissue is rarely necessary and has shown less than predictable results. MEDICATION  Nonsteroidal anti-inflammatory medications, such as aspirin and ibuprofen, may be used for pain and inflammation relief. Do not take within 7 days before surgery. Take these as directed by your caregiver. Contact your caregiver immediately if any bleeding, stomach upset, or signs of allergic reaction occur. Other minor pain relievers, such as acetaminophen, may also be used. Pain relievers may be prescribed as necessary by your caregiver. Do not take prescription pain medication for longer than 4 to 7 days. Use only as directed and only as much as you need. Cortisone injections are rarely indicated. Cortisone injections may weaken tendons and predispose to rupture. It is better to give the condition more time to heal than to use them. HEAT AND COLD Cold is used to relieve pain and reduce inflammation for acute and chronic Achilles tendinitis. Cold should be applied for 10 to 15 minutes every 2 to 3 hours for inflammation and pain and immediately after any activity that aggravates your symptoms. Use ice packs or an ice massage. Heat may be used before performing stretching  and  strengthening activities prescribed by your caregiver. Use a heat pack or a warm soak. SEEK MEDICAL CARE IF: Symptoms get worse or do not improve in 2 weeks despite treatment. New, unexplained symptoms develop. Drugs used in treatment may produce side effects.  EXERCISES:  RANGE OF MOTION (ROM) AND STRETCHING EXERCISES - Achilles Tendinitis  These exercises may help you when beginning to rehabilitate your injury. Your symptoms may resolve with or without further involvement from your physician, physical therapist or athletic trainer. While completing these exercises, remember:  Restoring tissue flexibility helps normal motion to return to the joints. This allows healthier, less painful movement and activity. An effective stretch should be held for at least 30 seconds. A stretch should never be painful. You should only feel a gentle lengthening or release in the stretched tissue.  STRETCH  Gastroc, Standing  Place hands on wall. Extend right / left leg, keeping the front knee somewhat bent. Slightly point your toes inward on your back foot. Keeping your right / left heel on the floor and your knee straight, shift your weight toward the wall, not allowing your back to arch. You should feel a gentle stretch in the right / left calf. Hold this position for 10 seconds. Repeat 3 times. Complete this stretch 2 times per day.  STRETCH  Soleus, Standing  Place hands on wall. Extend right / left leg, keeping the other knee somewhat bent. Slightly point your toes inward on your back foot. Keep your right / left heel on the floor, bend your back knee, and slightly shift your weight over the back leg so that you feel a gentle stretch deep in your back calf. Hold this position for 10 seconds. Repeat 3 times. Complete this stretch 2 times per day.  STRETCH  Gastrocsoleus, Standing  Note: This exercise can place a lot of stress on your foot and ankle. Please complete this exercise only if specifically  instructed by your caregiver.  Place the ball of your right / left foot on a step, keeping your other foot firmly on the same step. Hold on to the wall or a rail for balance. Slowly lift your other foot, allowing your body weight to press your heel down over the edge of the step. You should feel a stretch in your right / left calf. Hold this position for 10 seconds. Repeat this exercise with a slight bend in your knee. Repeat 3 times. Complete this stretch 2 times per day.   STRENGTHENING EXERCISES - Achilles Tendinitis These exercises may help you when beginning to rehabilitate your injury. They may resolve your symptoms with or without further involvement from your physician, physical therapist or athletic trainer. While completing these exercises, remember:  Muscles can gain both the endurance and the strength needed for everyday activities through controlled exercises. Complete these exercises as instructed by your physician, physical therapist or athletic trainer. Progress the resistance and repetitions only as guided. You may experience muscle soreness or fatigue, but the pain or discomfort you are trying to eliminate should never worsen during these exercises. If this pain does worsen, stop and make certain you are following the directions exactly. If the pain is still present after adjustments, discontinue the exercise until you can discuss the trouble with your clinician.  STRENGTH - Plantar-flexors  Sit with your right / left leg extended. Holding onto both ends of a rubber exercise band/tubing, loop it around the ball of your foot. Keep a slight tension in the band. Slowly  push your toes away from you, pointing them downward. Hold this position for 10 seconds. Return slowly, controlling the tension in the band/tubing. Repeat 3 times. Complete this exercise 2 times per day.   STRENGTH - Plantar-flexors  Stand with your feet shoulder width apart. Steady yourself with a wall or table  using as little support as needed. Keeping your weight evenly spread over the width of your feet, rise up on your toes.* Hold this position for 10 seconds. Repeat 3 times. Complete this exercise 2 times per day.  *If this is too easy, shift your weight toward your right / left leg until you feel challenged. Ultimately, you may be asked to do this exercise with your right / left foot only.  STRENGTH  Plantar-flexors, Eccentric  Note: This exercise can place a lot of stress on your foot and ankle. Please complete this exercise only if specifically instructed by your caregiver.  Place the balls of your feet on a step. With your hands, use only enough support from a wall or rail to keep your balance. Keep your knees straight and rise up on your toes. Slowly shift your weight entirely to your right / left toes and pick up your opposite foot. Gently and with controlled movement, lower your weight through your right / left foot so that your heel drops below the level of the step. You will feel a slight stretch in the back of your calf at the end position. Use the healthy leg to help rise up onto the balls of both feet, then lower weight only on the right / left leg again. Build up to 15 repetitions. Then progress to 3 consecutive sets of 15 repetitions.* After completing the above exercise, complete the same exercise with a slight knee bend (about 30 degrees). Again, build up to 15 repetitions. Then progress to 3 consecutive sets of 15 repetitions.* Perform this exercise 2 times per day.  *When you easily complete 3 sets of 15, your physician, physical therapist or athletic trainer may advise you to add resistance by wearing a backpack filled with additional weight.  STRENGTH - Plantar Flexors, Seated  Sit on a chair that allows your feet to rest flat on the ground. If necessary, sit at the edge of the chair. Keeping your toes firmly on the ground, lift your right / left heel as far as you can without  increasing any discomfort in your ankle. Repeat 3 times. Complete this exercise 2 times a day.   Look for an "EvenUp" shoe attachment on [employer] or at [employer]. This will level out your hips while you are walking in the CAM boot. Wear this on the other foot around a supportive sneaker:

## 2023-03-16 NOTE — Progress Notes (Signed)
Chief Complaint  Patient presents with   Foot Pain    Dm II. Toe nail trim both feet.  Big toes have think nails.  Rt foot pain - possible achilles    HPI: 77 y.o. male presents today for diabetic footcare.  He presents with thickened, elongated, dystrophic toenails that he is unable to maintain himself due to the morphology, they become painful with shoes.  He also complains of painful calluses.  Endorses numbness and tingling to the toes.  Last A1c 6.4.  Patient complains of soreness and achiness to the back of the right heel which has been going on for the last 3 weeks or so now. Patient is concerned has he does like to stay active.  Patient denies any nausea, vomiting, fever, chills, chest pain, shortness of breath.  Past Medical History:  Diagnosis Date   History of colonoscopy    [facility] [place] [facility]   History of MRI 04/01/2017   Hypertension    Wears glasses     Past Surgical History:  Procedure Laterality Date   BIOPSY PROSTATE  05/11/2021   CATARACT EXTRACTION  07/2021   CATARACT EXTRACTION  10/2021   INGUINAL HERNIA REPAIR Right 04/01/1965   Dr Nelma Rothman. [place] per [facility] new patient packet    INGUINAL HERNIA REPAIR Left 04/02/1991   [facility], Per [facility] New Patient Packet     Allergies  Allergen Reactions   Other Rash    Seafood     ROS negative except as stated in HPI.   Physical Exam: There were no vitals filed for this visit.  General: The patient is alert and oriented x3 in no acute distress.  Dermatology: Skin is warm, dry and supple bilateral lower extremities. Interspaces are clear of maceration and debris.  Nails x 10 notable for increased thickness, increased length, dystrophic appearance with subungual debris consistent with mycotic infection.  Tenderness on direct dorsal palpation of the nail plates x 10.  Preulcerative callus present subfifth metatarsal head bilaterally, and nucleated lesion present subthird metatarsal  head.  Vascular: Palpable pedal pulses bilaterally. Capillary refill within normal limits.  No appreciable edema.  No erythema or calor.  Diminished pedal hair growth  Neurological: Light touch sensation grossly intact bilateral feet.  Vibratory sensation diminished bilaterally.  Protective sensation diminished, present at 8/10 sites bilaterally.  Musculoskeletal Exam: Muscle strength 5/5 in dorsiflexion, paraphasia, inversion, eversion.  Decreased ankle joint dorsiflexion bilaterally with knee extended less than 10 degrees past neutral.  Localized warmth to right posterior calcaneus at level of Achilles tendon insertion.  Tenderness on palpation of this area.  No tenderness along watershed area of Achilles tendon.   Assessment/Plan of Care: 1. Type 2 diabetes mellitus with diabetic neuropathy, without long-term current use of insulin (HCC)   2. Achilles tendinitis of right lower extremity   3. Pre-ulcerative calluses   4. Pain due to onychomycosis of toenails of both feet      Meds ordered this encounter  Medications   methylPREDNISolone (MEDROL DOSEPAK) 4 MG TBPK tablet    Sig: 6 Day Tapering Dose    Dispense:  21 tablet    Refill:  0   None  Discussed clinical findings with patient today.  # Type 2 diabetes with neuropathy -Patient educated on diabetes. Discussed proper diabetic foot care and discussed risks and complications of disease. Educated patient in depth on reasons to return to the office immediately should he/she discover anything concerning or new on  the feet. All questions answered. Discussed proper shoes as well.   # Pain due to onychomycosis of toenails x 10 -Nails x 10 were debrided in thickness and length using aseptic nail nippers without incident - Mechanical bur was used to file down the nails  # Preulcerative callus x 3 -All symptomatic hyperkeratoses were safely debrided with a sterile #15 blade to patient's level of comfort without incident. We discussed  preventative and palliative care of these lesions including supportive and accommodative shoegear, padding, prefabricated and custom molded accommodative orthoses, use of a pumice stone and lotions/creams daily.   # Achilles tendinitis right lower extremity -Etiology of the Achilles tendinitis was discussed with the patient at length - Discussed with patient that this is an overuse injury requiring a period of immobilization and often times requires physical therapy -Cam boot dispensed to the patient to be worn with felt heel lifts when ambulatory -Achilles tendinitis stretching and rehab regimen discussed with the patient at length -Starting patient on 6-day course of oral steroid taper for pain and inflammation.  Did discuss that he may notice increased blood sugar during this time and to exercise caution with this.  Meloxicam deferred as patient states he cannot take NSAIDs. -Follow-up in 4 weeks for reevaluation     Hermes Wafer L. Marchia Bond, AACFAS [facility]     [address] [place], [place] [address]                Office [phone]  Fax [phone]

## 2023-04-01 ENCOUNTER — Encounter: Payer: Medicare Other | Admitting: Nurse Practitioner

## 2023-04-04 ENCOUNTER — Other Ambulatory Visit: Payer: Self-pay | Admitting: Nurse Practitioner

## 2023-04-17 ENCOUNTER — Ambulatory Visit: Payer: Medicare Other | Admitting: Podiatry

## 2023-04-18 ENCOUNTER — Encounter: Payer: Medicare Other | Admitting: Student

## 2023-04-23 ENCOUNTER — Other Ambulatory Visit: Payer: Self-pay | Admitting: Nurse Practitioner

## 2023-04-23 DIAGNOSIS — N401 Enlarged prostate with lower urinary tract symptoms: Secondary | ICD-10-CM

## 2023-04-25 ENCOUNTER — Ambulatory Visit (INDEPENDENT_AMBULATORY_CARE_PROVIDER_SITE_OTHER): Payer: Medicare Other | Admitting: Podiatry

## 2023-04-25 ENCOUNTER — Ambulatory Visit (INDEPENDENT_AMBULATORY_CARE_PROVIDER_SITE_OTHER): Payer: Medicare Other

## 2023-04-25 DIAGNOSIS — M7661 Achilles tendinitis, right leg: Secondary | ICD-10-CM

## 2023-04-25 DIAGNOSIS — M778 Other enthesopathies, not elsewhere classified: Secondary | ICD-10-CM

## 2023-04-25 NOTE — Progress Notes (Unsigned)
Chief Complaint  Patient presents with   Achilles Tendinitis    RT ankle. Feeling better since wearing the boot. Believes it was more the achilles tendon being tight. Was not able to do the steroid pack because pharmacy never filled it.     HPI: 78 y.o. male presents for follow-up evaluation of right Achilles tendinitis.  He has been compliant with the cam boot.  He has been keeping up with stretching.  States that he never received the steroid pack because it was not filled by the pharmacy.  Despite this he states that the pain is much better and has improved.  Patient denies any nausea, vomiting, fever, chills, chest pain, shortness of breath.  Past Medical History:  Diagnosis Date   History of colonoscopy    [facility] [place] [facility]   History of MRI 04/01/2017   Hypertension    Wears glasses     Past Surgical History:  Procedure Laterality Date   BIOPSY PROSTATE  05/11/2021   CATARACT EXTRACTION  07/2021   CATARACT EXTRACTION  10/2021   INGUINAL HERNIA REPAIR Right 04/01/1965   Dr Nelma Rothman. [place] per [facility] new patient packet    INGUINAL HERNIA REPAIR Left 04/02/1991   [facility], Per [facility] New Patient Packet     Allergies  Allergen Reactions   Other Rash    Seafood     ROS negative except as stated in HPI.   Physical Exam: There were no vitals filed for this visit.  General: The patient is alert and oriented x3 in no acute distress.  Dermatology: Skin is warm, dry and supple bilateral lower extremities. Interspaces are clear of maceration and debris.  Vascular: Palpable pedal pulses bilaterally. Capillary refill within normal limits.  No appreciable edema.  No erythema or calor.  Diminished pedal hair growth  Neurological: Light touch sensation grossly intact bilateral feet.  Vibratory sensation diminished bilaterally.  Protective sensation diminished, present at 8/10 sites bilaterally.  Musculoskeletal Exam: Muscle strength 5/5 in  dorsiflexion, paraphasia, inversion, eversion.  Decreased ankle joint dorsiflexion bilaterally with knee extended less than 10 degrees past neutral.  No tenderness on palpation of the Achilles tendon.  Radiographs: Right foot weightbearing 2 views 04/25/2023 Normal osseous mineralization.  Joint spaces preserved.  No fractures or acute osseous abnormalities.  Some spurring present at the posterior calcaneal tuberosity and plantar calcaneal tubercle  Assessment/Plan of Care: 1. Achilles tendinitis, right leg      No orders of the defined types were placed in this encounter.  None  Discussed clinical findings with patient today.  # Achilles tendinitis right lower extremity -Etiology of the Achilles tendinitis was discussed with the patient at length -Radiographs reviewed with the patient. - He has had significant improvement with cam boot immobilization - Dispensed heel lifts x 2 today to be worn in his regular shoes over the next 2 weeks.  After 2 weeks can remove one of the heel lifts.  May remove it once 2 weeks following this. -Okay to use topical Voltaren gel. -Continue with stretching and rehab. -May gradually increase weightbearing activity outside the cam boot   Follow-up in approximately 2 months for diabetic footcare or sooner if new pedal complaints arise.   Lashonda Sonneborn L. Marchia Bond, AACFAS [facility]     [address]  [place], [place] [address]                Office [phone]  Fax [phone]

## 2023-04-25 NOTE — Patient Instructions (Signed)

## 2023-04-28 ENCOUNTER — Encounter: Payer: Self-pay | Admitting: Podiatry

## 2023-05-02 ENCOUNTER — Ambulatory Visit (INDEPENDENT_AMBULATORY_CARE_PROVIDER_SITE_OTHER): Payer: Medicare Other | Admitting: Nurse Practitioner

## 2023-05-02 ENCOUNTER — Encounter: Payer: Self-pay | Admitting: Nurse Practitioner

## 2023-05-02 VITALS — BP 132/88 | HR 88 | Temp 98.0°F | Resp 17 | Ht 72.0 in | Wt 281.1 lb

## 2023-05-02 DIAGNOSIS — Z0000 Encounter for general adult medical examination without abnormal findings: Secondary | ICD-10-CM

## 2023-05-02 NOTE — Patient Instructions (Signed)
  Darren Fisher , Thank you for taking time to come for your Medicare Wellness Visit. I appreciate your ongoing commitment to your health goals. Please review the following plan we discussed and let me know if I can assist you in the future.   Make sure you are up to date on TDAP and shingles vaccine at local pharmacy They can print out your vaccine record and you can bring to [place].    This is a list of the screening recommended for you and due dates:  Health Maintenance  Topic Date Due   DTaP/Tdap/Td vaccine (1 - Tdap) Never done   Zoster (Shingles) Vaccine (2 of 2) 10/28/2019   COVID-19 Vaccine (5 - 2024-25 season) 04/01/2024*   Hemoglobin A1C  08/21/2023   Eye exam for diabetics  02/18/2024   Yearly kidney function blood test for diabetes  02/21/2024   Complete foot exam   02/21/2024   Yearly kidney health urinalysis for diabetes  02/25/2024   Medicare Annual Wellness Visit  05/01/2024   Pneumonia Vaccine  Completed   Flu Shot  Completed   Hepatitis C Screening  Completed   HPV Vaccine  Aged Out   Colon Cancer Screening  Discontinued  *Topic was postponed. The date shown is not the original due date.

## 2023-05-02 NOTE — Progress Notes (Signed)
Subjective:   Darren Fisher is a 78 y.o. male who presents for Medicare Annual/Subsequent preventive examination.  Visit Complete: In person   Cardiac Risk Factors include: obesity (BMI >30kg/m2);advanced age (>32men, >53 women);diabetes mellitus;hypertension;dyslipidemia;male gender     Objective:    Today's Vitals   05/02/23 1258  BP: 132/88  Pulse: 88  Resp: 17  Temp: 98 F (36.7 C)  TempSrc: Temporal  SpO2: 97%  Weight: 281 lb 1.6 oz (127.5 kg)  Height: 6' (1.829 m)   Body mass index is 38.12 kg/m.     05/02/2023    1:00 PM 11/08/2022    3:39 PM 04/15/2022    9:36 AM 03/19/2022    9:56 AM 01/29/2022    1:24 PM 12/07/2021    9:40 AM 05/25/2021    1:22 PM  Advanced Directives  Does Patient Have a Medical Advance Directive? No No No No No No No  Would patient like information on creating a medical advance directive? No - Patient declined No - Patient declined No - Patient declined  No - Patient declined No - Patient declined No - Patient declined    Current Medications (verified) Outpatient Encounter Medications as of 05/02/2023  Medication Sig   amLODipine (NORVASC) 10 MG tablet TAKE 1 TABLET(10 MG) BY MOUTH DAILY   Blood Glucose Monitoring Suppl DEVI 1 each by Does not apply route in the morning, at noon, and at bedtime. May substitute to any manufacturer covered by patient's insurance.   Cyanocobalamin (B-12 PO) Take 1 tablet by mouth daily.   ELDERBERRY PO Take 50 mg by mouth as needed.   finasteride (PROSCAR) 5 MG tablet Take 5 mg by mouth daily.   fluticasone (FLONASE) 50 MCG/ACT nasal spray Place into both nostrils daily.   Insulin Pen Needle (PEN NEEDLES) 32G X 4 MM MISC Use to give injection once weekly. Dx: E11.40   losartan (COZAAR) 100 MG tablet TAKE 1 TABLET(100 MG) BY MOUTH DAILY   Metamucil Fiber CHEW Chew by mouth daily.   metFORMIN (GLUCOPHAGE) 1000 MG tablet Take 1 tablet (1,000 mg total) by mouth 2 (two) times daily with a meal.   milk thistle  175 MG tablet Take 175 mg by mouth daily.   Multiple Vitamins-Minerals (OCUVITE PO) Take 1 capsule by mouth daily.    Nutritional Supplements (KIDNEY) CAPS Take 2 capsules by mouth daily.   nystatin (MYCOSTATIN/NYSTOP) powder Apply 1 Application topically 3 (three) times daily.   Omega-3 Krill Oil 500 MG CAPS Take 500 mg by mouth daily.   omeprazole (PRILOSEC) 20 MG capsule Take 20 mg by mouth as needed.   OZEMPIC, 0.25 OR 0.5 MG/DOSE, 2 MG/3ML SOPN INJECT 0.25 MG UNDER THE SKIN ONCE A WEEK   rosuvastatin (CRESTOR) 10 MG tablet TAKE 1 TABLET(10 MG) BY MOUTH DAILY   tamsulosin (FLOMAX) 0.4 MG CAPS capsule TAKE 1 CAPSULE(0.4 MG) BY MOUTH DAILY   triamcinolone cream (KENALOG) 0.1 % Apply 1 Application topically 2 (two) times daily.   No facility-administered encounter medications on file as of 05/02/2023.    Allergies (verified) Other   History: Past Medical History:  Diagnosis Date   History of colonoscopy    [facility]   History of MRI 04/01/2017   Hypertension    Wears glasses    Past Surgical History:  Procedure Laterality Date   BIOPSY PROSTATE  05/11/2021   CATARACT EXTRACTION  07/2021   CATARACT EXTRACTION  10/2021   INGUINAL HERNIA REPAIR Right 04/01/1965   Dr R.L.  Richardson per [facility] new patient packet    INGUINAL HERNIA REPAIR Left 04/02/1991   [facility], Per [facility] New Patient Packet    Family History  Problem Relation Age of Onset   Asthma Father    Other Sister    Diabetes Brother    Congestive Heart Failure Brother    Stomach cancer Sister    Obesity Brother    Social History   Socioeconomic History   Marital status: Married    Spouse name: Not on file   Number of children: Not on file   Years of education: Not on file   Highest education level: Not on file  Occupational History   Not on file  Tobacco Use   Smoking status: Never   Smokeless tobacco: Never  Vaping Use   Vaping status: Never Used  Substance and  Sexual Activity   Alcohol use: Not Currently   Drug use: Never   Sexual activity: Not on file  Other Topics Concern   Not on file  Social History Narrative   Tobacco use, amount per day now: None.   Past tobacco use, amount per day:   How many years did you use tobacco:   Alcohol use (drinks per week): Social ( some wine, some beer )   Diet:   Do you drink/eat things with caffeine: Soda.   Marital status: Married                                 What year were you married? 1969   Do you live in a house, apartment, assisted living, condo, trailer, etc.? House.   Is it one or more stories? Single Floor.   How many persons live in your home? Two.   Do you have pets in your home?( please list) None.   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   Do you exercise? Walking.                                    Type and how often? Daily   Do you have a living will? No   Do you have a DNR form? No                                   If not, do you want to discuss one?   Do you have signed POA/HPOA forms? No                       If so, please bring to you appointment    Do you have any difficulty bathing or dressing yourself? No   Do you have difficulty preparing food or eating? No   Do you have difficulty managing your medications? No   Do you have any difficulty managing your finances? No   Do you have any difficulty affording your medications? No      Social Drivers of Corporate investment banker Strain: Not on file  Food Insecurity: Not on file  Transportation Needs: Not on file  Physical Activity: Not on file  Stress: Not on file  Social Connections: Not on file    Tobacco Counseling Counseling given: Not Answered   Clinical Intake:  Pre-visit  preparation completed: Yes  Pain : No/denies pain     BMI - recorded: 38 Nutritional Status: BMI > 30  Obese Diabetes: Yes  How often do you need to have someone help you when  you read instructions, pamphlets, or other written materials from your doctor or pharmacy?: 1 - Never         Activities of Daily Living    05/02/2023    1:17 PM 05/02/2023    1:00 PM  In your present state of health, do you have any difficulty performing the following activities:  Hearing? 0 0  Vision? 0 0  Difficulty concentrating or making decisions? 0 0  Walking or climbing stairs? 0 0  Dressing or bathing? 0 0  Doing errands, shopping? 0 0  Preparing Food and eating ? N   Using the Toilet? N   In the past six months, have you accidently leaked urine? Y   Do you have problems with loss of bowel control? N   Managing your Medications? N   Managing your Finances? N   Housekeeping or managing your Housekeeping? N     Patient Care Team: Sharon Seller, NP as PCP - General (Geriatric Medicine)  Indicate any recent Medical Services you may have received from other than [facility] providers in the past year (date may be approximate).     Assessment:   This is a routine wellness examination for Warren.  Hearing/Vision screen No results found.   Goals Addressed   None    Depression Screen    05/02/2023    1:00 PM 02/21/2023    1:30 PM 11/08/2022    3:40 PM 04/15/2022    9:36 AM 03/19/2022   10:18 AM 05/25/2021    1:21 PM 03/15/2021    9:25 AM  PHQ 2/9 Scores  PHQ - 2 Score 0 0 0 0 0 0 0  PHQ- 9 Score   0        Fall Risk    05/02/2023    1:00 PM 02/21/2023    1:30 PM 11/08/2022    3:40 PM 04/15/2022    9:35 AM 03/19/2022   10:19 AM  Fall Risk   Falls in the past year? 0 0 0 0 0  Number falls in past yr: 0 0 0 0 0  Injury with Fall? 0 0 0 0 0  Risk for fall due to : No Fall Risks No Fall Risks No Fall Risks No Fall Risks No Fall Risks  Follow up Falls evaluation completed Falls evaluation completed Falls evaluation completed Falls evaluation completed Falls evaluation completed    MEDICARE RISK AT HOME: Medicare Risk at Home Any stairs in or around the home?:  Yes If so, are there any without handrails?: No Home free of loose throw rugs in walkways, pet beds, electrical cords, etc?: Yes Adequate lighting in your home to reduce risk of falls?: Yes Life alert?: No Use of a cane, walker or w/c?: No Grab bars in the bathroom?: Yes Shower chair or bench in shower?: Yes Elevated toilet seat or a handicapped toilet?: Yes  TIMED UP AND GO:  Was the test performed?  No    Cognitive Function:        03/19/2022   10:19 AM 03/15/2021    9:27 AM  6CIT Screen  What Year? 0 points 0 points  What month? 0 points 0 points  What time? 0 points 0 points  Count back from 20 0 points 0 points  Months in reverse 0 points 0 points  Repeat phrase 0 points 0 points  Total Score 0 points 0 points    Immunizations Immunization History  Administered Date(s) Administered   Fluad Quad(high Dose 65+) 12/29/2019, 02/19/2021, 12/07/2021, 12/01/2022   PFIZER(Purple Top)SARS-COV-2 Vaccination 04/23/2019, 05/14/2019, 01/27/2020, 12/31/2022   Pneumococcal Conjugate-13 11/10/2020   Pneumococcal Polysaccharide-23 04/01/2017   Zoster Recombinant(Shingrix) 09/02/2019    TDAP status: Due, Education has been provided regarding the importance of this vaccine. Advised may receive this vaccine at local pharmacy or Health Dept. Aware to provide a copy of the vaccination record if obtained from local pharmacy or Health Dept. Verbalized acceptance and understanding.  Flu Vaccine status: Up to date  Pneumococcal vaccine status: Up to date  Covid-19 vaccine status: Information provided on how to obtain vaccines.   Qualifies for Shingles Vaccine? Yes   Zostavax completed No   Shingrix Completed?: No.    Education has been provided regarding the importance of this vaccine. Patient has been advised to call insurance company to determine out of pocket expense if they have not yet received this vaccine. Advised may also receive vaccine at local pharmacy or Health Dept.  Verbalized acceptance and understanding.  Screening Tests Health Maintenance  Topic Date Due   DTaP/Tdap/Td (1 - Tdap) Never done   Zoster Vaccines- Shingrix (2 of 2) 10/28/2019   COVID-19 Vaccine (5 - 2024-25 season) 04/01/2024 (Originally 02/25/2023)   HEMOGLOBIN A1C  08/21/2023   OPHTHALMOLOGY EXAM  02/18/2024   Diabetic kidney evaluation - eGFR measurement  02/21/2024   FOOT EXAM  02/21/2024   Diabetic kidney evaluation - Urine ACR  02/25/2024   Medicare Annual Wellness (AWV)  05/01/2024   Pneumonia Vaccine 24+ Years old  Completed   INFLUENZA VACCINE  Completed   Hepatitis C Screening  Completed   HPV VACCINES  Aged Out   Colonoscopy  Discontinued    Health Maintenance  Health Maintenance Due  Topic Date Due   DTaP/Tdap/Td (1 - Tdap) Never done   Zoster Vaccines- Shingrix (2 of 2) 10/28/2019    Colorectal cancer screening: No longer required.   Lung Cancer Screening: (Low Dose CT Chest recommended if Age 48-80 years, 20 pack-year currently smoking OR have quit w/in 15years.) does not qualify.   Lung Cancer Screening Referral: na  Additional Screening:  Hepatitis C Screening: does qualify; Completed  Vision Screening: Recommended annual ophthalmology exams for early detection of glaucoma and other disorders of the eye. Is the patient up to date with their annual eye exam?  Yes  Who is the provider or what is the name of the office in which the patient attends annual eye exams? [facility] If pt is not established with a provider, would they like to be referred to a provider to establish care? No .   Dental Screening: Recommended annual dental exams for proper oral hygiene  Diabetic Foot Exam: Diabetic Foot Exam: Completed 02/25/2023  Community Resource Referral / Chronic Care Management: CRR required this visit?  No   CCM required this visit?  No     Plan:     I have personally reviewed and noted the following in the patient's chart:   Medical and social  history Use of alcohol, tobacco or illicit drugs  Current medications and supplements including opioid prescriptions. Patient is not currently taking opioid prescriptions. Functional ability and status Nutritional status Physical activity Advanced directives List of other physicians Hospitalizations, surgeries, and ER visits in previous 12 months Vitals Screenings to include  cognitive, depression, and falls Referrals and appointments  In addition, I have reviewed and discussed with patient certain preventive protocols, quality metrics, and best practice recommendations. A written personalized care plan for preventive services as well as general preventive health recommendations were provided to patient.     Sharon Seller, NP   05/02/2023   After Visit Summary: (In Person-Printed) AVS printed and given to the patient

## 2023-07-04 ENCOUNTER — Ambulatory Visit (INDEPENDENT_AMBULATORY_CARE_PROVIDER_SITE_OTHER): Payer: Medicare Other | Admitting: Podiatry

## 2023-07-04 ENCOUNTER — Encounter: Payer: Self-pay | Admitting: Podiatry

## 2023-07-04 DIAGNOSIS — M79675 Pain in left toe(s): Secondary | ICD-10-CM

## 2023-07-04 DIAGNOSIS — B351 Tinea unguium: Secondary | ICD-10-CM | POA: Diagnosis not present

## 2023-07-04 DIAGNOSIS — M79674 Pain in right toe(s): Secondary | ICD-10-CM | POA: Diagnosis not present

## 2023-07-04 DIAGNOSIS — E1140 Type 2 diabetes mellitus with diabetic neuropathy, unspecified: Secondary | ICD-10-CM

## 2023-07-04 DIAGNOSIS — M7661 Achilles tendinitis, right leg: Secondary | ICD-10-CM

## 2023-07-04 MED ORDER — DICLOFENAC SODIUM 1 % EX GEL: 2.0000 g | Freq: Four times a day (QID) | CUTANEOUS | 1 refills | Status: AC

## 2023-07-04 NOTE — Patient Instructions (Signed)

## 2023-07-04 NOTE — Progress Notes (Signed)
 Chief Complaint  Patient presents with   Diabetes    "Cut my toenails and check my achilles.  It's doing a little better.  It get a little tight."    HPI: 78 y.o. male presents today for diabetic footcare.  He presents with thickened, elongated, dystrophic toenails that he is unable to maintain himself due to the morphology, they become painful with shoes.  He would also like to discuss treatment for this.  He also complains of painful calluses.  Right Achilles tinnitus overall improving per patient, he does report some stiffness and some mild achiness at times.  Patient denies any nausea, vomiting, fever, chills, chest pain, shortness of breath.  Past Medical History:  Diagnosis Date   History of colonoscopy    [facility]   History of MRI 04/01/2017   Hypertension    Wears glasses     Past Surgical History:  Procedure Laterality Date   BIOPSY PROSTATE  05/11/2021   CATARACT EXTRACTION  07/2021   CATARACT EXTRACTION  10/2021   INGUINAL HERNIA REPAIR Right 04/01/1965   Dr Nelma Rothman. Richardson per [facility] new patient packet    INGUINAL HERNIA REPAIR Left 04/02/1991   [facility], Per [facility] New Patient Packet     Allergies  Allergen Reactions   Other Rash    Seafood     ROS negative except as stated in HPI.   Physical Exam: There were no vitals filed for this visit.  General: The patient is alert and oriented x3 in no acute distress.  Dermatology: Skin is warm, dry and supple bilateral lower extremities. Interspaces are clear of maceration and debris.  Nails x 10 notable for increased thickness, increased length, dystrophic appearance with subungual debris consistent with mycotic infection.  Tenderness on direct dorsal palpation of the nail plates x 10.    Vascular: Palpable pedal pulses bilaterally. Capillary refill within normal limits.  No appreciable edema.  No erythema or calor.  Diminished pedal hair growth  Neurological: Light  touch sensation grossly intact bilateral feet.  Vibratory sensation diminished bilaterally.  Protective sensation diminished, present at 8/10 sites bilaterally.  Musculoskeletal Exam: Muscle strength 5/5 in dorsiflexion, plantarflexion, inversion, eversion.  Decreased ankle joint dorsiflexion bilaterally with knee extended less than 10 degrees past neutral.  Mild tenderness at right Achilles insertion   Assessment/Plan of Care: 1. Achilles tendinitis, right leg   2. Type 2 diabetes mellitus with diabetic neuropathy, without long-term current use of insulin (HCC)   3. Pain due to onychomycosis of toenails of both feet      Meds ordered this encounter  Medications   diclofenac Sodium (VOLTAREN) 1 % GEL    Sig: Apply 2 g topically 4 (four) times daily for 25 days.    Dispense:  100 g    Refill:  1   None  Discussed clinical findings with patient today.  # Type 2 diabetes with neuropathy -Patient educated on diabetes. Discussed proper diabetic foot care and discussed risks and complications of disease. Educated patient in depth on reasons to return to the office immediately should he discover anything concerning or new on the feet. All questions answered. Discussed proper shoes as well.   # Pain due to onychomycosis of toenails x 10 -Nails x 10 were debrided in thickness and length using aseptic nail nippers without incident - Mechanical bur was used to file down the nails -Patient wishing to discuss potential pharmacological treatment options.  We did discuss  oral and topical medication.  Nail specimen sent out for fungal pathology  # Achilles tendinitis right lower extremity - Overall improving.  Discussed continued stretching, icing, RICE therapy -Topical diclofenac gel prescribed to patient can be applied 3-4 times a day massaged into the right posterior heel -Continue use of good supportive shoes with heel lifts. -Can consider PT if no improvement  Follow-up in about 3-4 weeks to  review nail pathology     Ashima Shrake L. Marchia Bond, AACFAS [facility]     [address] [place], [place] [address]                Office [phone]  Fax [phone]

## 2023-07-06 ENCOUNTER — Encounter: Payer: Self-pay | Admitting: Podiatry

## 2023-07-22 ENCOUNTER — Other Ambulatory Visit: Payer: Self-pay | Admitting: Nurse Practitioner

## 2023-07-22 DIAGNOSIS — E782 Mixed hyperlipidemia: Secondary | ICD-10-CM

## 2023-07-23 LAB — HM DIABETES EYE EXAM

## 2023-07-25 ENCOUNTER — Other Ambulatory Visit: Payer: Self-pay | Admitting: Podiatry

## 2023-07-27 ENCOUNTER — Other Ambulatory Visit: Payer: Self-pay | Admitting: Nurse Practitioner

## 2023-08-29 ENCOUNTER — Encounter: Payer: Medicare Other | Admitting: Nurse Practitioner

## 2023-08-29 NOTE — Progress Notes (Signed)
 This encounter was created in error - please disregard.

## 2023-09-22 ENCOUNTER — Other Ambulatory Visit: Payer: Self-pay | Admitting: Nurse Practitioner

## 2023-09-22 DIAGNOSIS — E1140 Type 2 diabetes mellitus with diabetic neuropathy, unspecified: Secondary | ICD-10-CM

## 2023-09-22 NOTE — Telephone Encounter (Unsigned)
 Copied from CRM [id number]. Topic: Clinical - Medication Refill >> Sep 22, 2023  2:26 PM Adrianna P wrote: Medication: OZEMPIC , 0.25 OR 0.5 MG/DOSE, 2 MG/3ML SOPN  Has the patient contacted their pharmacy? Yes (Agent: If no, request that the patient contact the pharmacy for the refill. If patient does not wish to contact the pharmacy document the reason why and proceed with request.) (Agent: If yes, when and what did the pharmacy advise?)  This is the patient's preferred pharmacy:  [facility] [phone] - [place], [place] - [address] AT [facility] [address] [place]  [address] Phone: [phone] Fax: [phone]  [facility] #[id number] [place] - [address] AT [facility] OF [facility] & [address] [place] [address] Phone: [phone] Fax: [phone]  [facility] #[id number] [place] - [address] AT [facility] OF [facility] & [address] [place] [address] Phone: [phone] Fax: [phone]  Is this the correct pharmacy for this prescription? Yes If no, delete pharmacy and type the correct one.   Has the prescription been filled recently? No  Is the patient out of the medication? Yes  Has the patient been seen for an appointment in the last year OR does the patient have an upcoming appointment? Yes  Can we respond through MyChart? Yes  Agent: Please be advised that Rx refills may take up to 3 business days. We ask that you follow-up with your pharmacy.

## 2023-09-23 NOTE — Telephone Encounter (Signed)
 Patient has request refill on medication Ozempic . Patient medication last refilled 09/05/2022. Patient has upcoming appointment 05/03/2024 for AWV. However patient last routine visit was 02/21/2023 and patient was advised to schedule follow up around 08/21/2023. Patient hasn't followed through with routine follow up appointment and No Showed 08/29/2023. Medication pend and sent to PCP Caro Jessica K, NP for approval.

## 2023-10-17 ENCOUNTER — Other Ambulatory Visit: Payer: Self-pay | Admitting: Nurse Practitioner

## 2023-10-27 ENCOUNTER — Other Ambulatory Visit: Payer: Self-pay | Admitting: Nurse Practitioner

## 2023-10-30 ENCOUNTER — Encounter: Payer: Self-pay | Admitting: Podiatry

## 2023-10-30 ENCOUNTER — Ambulatory Visit: Admitting: Podiatry

## 2023-10-30 DIAGNOSIS — L84 Corns and callosities: Secondary | ICD-10-CM | POA: Diagnosis not present

## 2023-10-30 DIAGNOSIS — B351 Tinea unguium: Secondary | ICD-10-CM | POA: Diagnosis not present

## 2023-10-30 DIAGNOSIS — E1140 Type 2 diabetes mellitus with diabetic neuropathy, unspecified: Secondary | ICD-10-CM | POA: Diagnosis not present

## 2023-10-30 DIAGNOSIS — M79675 Pain in left toe(s): Secondary | ICD-10-CM | POA: Diagnosis not present

## 2023-10-30 DIAGNOSIS — M79674 Pain in right toe(s): Secondary | ICD-10-CM

## 2023-10-30 NOTE — Progress Notes (Signed)
  Subjective:  Patient ID: Darren Fisher, male    DOB: 08/26/1945,  MRN: [id number]  Chief Complaint  Patient presents with   Diabetes    Pt is here for RFC Also like to check on his R achilles A1c  6.4  Saw Jessica Eubanks 3  to 4 months ago    78 y.o. male presents with the above complaint. History confirmed with patient. Patient presenting with pain related to dystrophic thickened elongated nails. Patient is unable to trim own nails related to nail dystrophy. Patient does have a history of T2DM well-controlled, last A1c 6.4. Patient does have painful callus present located at the bilateral subfifth metatarsal head causing pain.  Does ask about his right Achilles where he was previously dealing with tendinitis, denies pain, does state that he notices some stiffness and tightness.  Objective:  Physical Exam: warm, good capillary refill, decreased pedal hair growth nail exam onychomycosis of the toenails, onycholysis, and dystrophic nails DP pulses palpable, PT pulses palpable, and protective sensation decreased Left Foot:  Pain with palpation of nails due to elongation and dystrophic growth.  Preulcerative callus subfifth metatarsal head Right Foot: Pain with palpation of nails due to elongation and dystrophic growth. Preulcerative callus subfifth metatarsal head No pain on palpation of right Achilles region or posterior heel.  No pain with range of motion.  Gastroc equinus noted bilaterally.  Assessment:   1. Pain due to onychomycosis of toenails of both feet   2. Type 2 diabetes mellitus with diabetic neuropathy, without long-term current use of insulin (HCC)   3. Pre-ulcerative calluses      Plan:  Patient was evaluated and treated and all questions answered.  #Pre ulcerative calluses present bilateral subfifth metatarsal head All symptomatic hyperkeratoses x 2 separate lesions were safely debrided with a sterile #312 blade to patient's level of comfort without incident. We  discussed preventative and palliative care of these lesions including supportive and accommodative shoegear, padding, prefabricated and custom molded accommodative orthoses, use of a pumice stone and lotions/creams daily.  #Onychomycosis with pain  -Nails palliatively debrided as below. -Educated on self-care  Procedure: Nail Debridement Rationale: Pain Type of Debridement: manual, sharp debridement. Instrumentation: Nail nipper, rotary burr. Number of Nails: 10  Patient educated on diabetes. Discussed proper diabetic foot care and discussed risks and complications of disease. Educated patient in depth on reasons to return to the office immediately should he/she discover anything concerning or new on the feet. All questions answered. Discussed proper shoes as well.    Return in about 3 months (around 01/30/2024) for Diabetic Foot Care.         Ethan Saddler, DPM [facility] / [facility]

## 2023-11-16 ENCOUNTER — Other Ambulatory Visit: Payer: Self-pay | Admitting: Nurse Practitioner

## 2023-11-16 DIAGNOSIS — E1140 Type 2 diabetes mellitus with diabetic neuropathy, unspecified: Secondary | ICD-10-CM

## 2024-01-13 ENCOUNTER — Other Ambulatory Visit: Payer: Self-pay | Admitting: Nurse Practitioner

## 2024-01-13 DIAGNOSIS — E782 Mixed hyperlipidemia: Secondary | ICD-10-CM

## 2024-01-29 ENCOUNTER — Ambulatory Visit (INDEPENDENT_AMBULATORY_CARE_PROVIDER_SITE_OTHER): Payer: Self-pay | Admitting: Podiatry

## 2024-01-29 DIAGNOSIS — Z91199 Patient's noncompliance with other medical treatment and regimen due to unspecified reason: Secondary | ICD-10-CM

## 2024-01-30 NOTE — Progress Notes (Signed)
Patient did not show for scheduled appointment today.

## 2024-04-05 ENCOUNTER — Encounter: Payer: Self-pay | Admitting: Nurse Practitioner

## 2024-04-05 ENCOUNTER — Ambulatory Visit (INDEPENDENT_AMBULATORY_CARE_PROVIDER_SITE_OTHER): Admitting: Nurse Practitioner

## 2024-04-05 ENCOUNTER — Other Ambulatory Visit: Payer: Self-pay | Admitting: Nurse Practitioner

## 2024-04-05 VITALS — BP 122/68 | HR 88 | Temp 97.5°F | Resp 18 | Ht 72.0 in | Wt 284.6 lb

## 2024-04-05 DIAGNOSIS — E1122 Type 2 diabetes mellitus with diabetic chronic kidney disease: Secondary | ICD-10-CM

## 2024-04-05 DIAGNOSIS — R351 Nocturia: Secondary | ICD-10-CM | POA: Diagnosis not present

## 2024-04-05 DIAGNOSIS — E1140 Type 2 diabetes mellitus with diabetic neuropathy, unspecified: Secondary | ICD-10-CM | POA: Diagnosis not present

## 2024-04-05 DIAGNOSIS — K219 Gastro-esophageal reflux disease without esophagitis: Secondary | ICD-10-CM | POA: Diagnosis not present

## 2024-04-05 DIAGNOSIS — K449 Diaphragmatic hernia without obstruction or gangrene: Secondary | ICD-10-CM

## 2024-04-05 DIAGNOSIS — N401 Enlarged prostate with lower urinary tract symptoms: Secondary | ICD-10-CM

## 2024-04-05 DIAGNOSIS — E782 Mixed hyperlipidemia: Secondary | ICD-10-CM | POA: Diagnosis not present

## 2024-04-05 DIAGNOSIS — I10 Essential (primary) hypertension: Secondary | ICD-10-CM

## 2024-04-05 DIAGNOSIS — E66812 Obesity, class 2: Secondary | ICD-10-CM

## 2024-04-05 DIAGNOSIS — Z7984 Long term (current) use of oral hypoglycemic drugs: Secondary | ICD-10-CM | POA: Diagnosis not present

## 2024-04-05 DIAGNOSIS — R972 Elevated prostate specific antigen [PSA]: Secondary | ICD-10-CM | POA: Diagnosis not present

## 2024-04-05 MED ORDER — OZEMPIC (0.25 OR 0.5 MG/DOSE) 2 MG/3ML ~~LOC~~ SOPN: 0.5000 mg | PEN_INJECTOR | SUBCUTANEOUS | 3 refills | Status: AC

## 2024-04-05 NOTE — Progress Notes (Signed)
 "   Careteam: Patient Care Team: Caro Harlene POUR, NP as PCP - General (Geriatric Medicine)  PLACE OF SERVICE:  [facility]  Advanced Directive information    Allergies[1]  Chief Complaint  Patient presents with   Medical Management of Chronic Issues    Routine follow-up    HPI:  Discussed the use of AI scribe software for clinical note transcription with the patient, who gave verbal consent to proceed.  History of Present Illness Darren Fisher is a 79 year old male with diabetes who presents for a follow-up visit.  He has not been seen in over a year and is overdue for blood work, including an A1c check. He is due for his annual wellness exam next month. He has received his flu and COVID vaccinations but is due for a tetanus shot.  He has a history of hiatal hernia diagnosed years ago during a colonoscopy/endoscopy in [place] . Since then, he has experienced symptoms of food getting stuck in his esophagus, particularly with 'stalky food' like broccoli and steak. He takes omeprazole and sometimes Prilosec for acid reflux, which helps manage his symptoms. He has cut back on certain foods and is concerned about his esophageal health.  He reports taking Ozempic  weekly for diabetes management and has had difficulty obtaining the medication. He also takes metformin . He mentions challenges with weight loss, particularly in his abdominal area, despite being on Ozempic .  He has a history of elevated PSA and underwent a prostate biopsy in 2023, which was benign with inflammation. He takes Proscar for prostate health and reports frequent urination.he did not follow up with urology further.  Currently taking finasteride for BPH.  He experiences tightness in his right Achilles, which worsens at night. He uses a Nerivio device and a copper brace for relief, and applies arthritic alcohol. He had followed up with podiatrist who advised him to wear a boot but he did not.   His current  medications include losartan  and amlodipine  for blood pressure which is controlled rosuvastatin  for cholesterol,   No chest pain, shortness of breath, or nausea.   He reports occasional constipation but regular bowel movements.    Review of Systems:  Review of Systems  Constitutional:  Negative for chills, fever and weight loss.  HENT:  Negative for tinnitus.   Respiratory:  Negative for cough, sputum production and shortness of breath.   Cardiovascular:  Negative for chest pain, palpitations and leg swelling.  Gastrointestinal:  Negative for abdominal pain, constipation, diarrhea and heartburn.       Reports sensation of food getting stuck  Genitourinary:  Negative for dysuria, frequency and urgency.  Musculoskeletal:  Negative for back pain, falls, joint pain and myalgias.  Skin: Negative.   Neurological:  Negative for dizziness and headaches.  Psychiatric/Behavioral:  Negative for depression and memory loss. The patient does not have insomnia.     Past Medical History:  Diagnosis Date   History of colonoscopy    [facility] [place]  [facility]   History of MRI 04/01/2017   Hypertension    Wears glasses    Past Surgical History:  Procedure Laterality Date   BIOPSY PROSTATE  05/11/2021   CATARACT EXTRACTION  07/2021   CATARACT EXTRACTION  10/2021   INGUINAL HERNIA REPAIR Right 04/01/1965   Dr WALTER. [facility] per [facility] new patient packet    INGUINAL HERNIA REPAIR Left 04/02/1991   [facility] [place]  [facility], Per [facility] New Patient Packet    Social History:   reports that he has  never smoked. He has never used smokeless tobacco. He reports that he does not currently use alcohol. He reports that he does not use drugs.  Family History  Problem Relation Age of Onset   Asthma Father    Other Sister    Diabetes Brother    Congestive Heart Failure Brother    Stomach cancer Sister    Obesity Brother     Medications: Patient's Medications  New Prescriptions   No  medications on file  Previous Medications   AMLODIPINE  (NORVASC ) 10 MG TABLET    TAKE 1 TABLET(10 MG) BY MOUTH DAILY   BLOOD GLUCOSE MONITORING SUPPL DEVI    1 each by Does not apply route in the morning, at noon, and at bedtime. May substitute to any manufacturer covered by patient's insurance.   CYANOCOBALAMIN (B-12 PO)    Take 1 tablet by mouth daily.   ELDERBERRY PO    Take 50 mg by mouth as needed.   FINASTERIDE (PROSCAR) 5 MG TABLET    Take 5 mg by mouth daily.   FLUTICASONE (FLONASE) 50 MCG/ACT NASAL SPRAY    Place into both nostrils daily.   INSULIN PEN NEEDLE (PEN NEEDLES) 32G X 4 MM MISC    Use to give injection once weekly. Dx: E11.40   LOSARTAN  (COZAAR ) 100 MG TABLET    Take 1 tablet (100 mg total) by mouth daily. Needs an appointment before anymore future refills.   METAMUCIL FIBER CHEW    Chew by mouth daily.   METFORMIN  (GLUCOPHAGE ) 1000 MG TABLET    TAKE 1 TABLET(1000 MG) BY MOUTH TWICE DAILY WITH A MEAL   MILK THISTLE 175 MG TABLET    Take 175 mg by mouth daily.   MULTIPLE VITAMINS-MINERALS (OCUVITE PO)    Take 1 capsule by mouth daily.    NUTRITIONAL SUPPLEMENTS (KIDNEY) CAPS    Take 2 capsules by mouth daily.   NYSTATIN  (MYCOSTATIN /NYSTOP ) POWDER    Apply 1 Application topically 3 (three) times daily.   OMEGA-3 KRILL OIL 500 MG CAPS    Take 500 mg by mouth daily.   OMEPRAZOLE (PRILOSEC) 20 MG CAPSULE    Take 20 mg by mouth as needed.   OZEMPIC , 0.25 OR 0.5 MG/DOSE, 2 MG/3ML SOPN    INJECT 0.25 MG UNDER THE SKIN UNDER THE SKIN ONCE A WEEK   ROSUVASTATIN  (CRESTOR ) 10 MG TABLET    TAKE 1 TABLET(10 MG) BY MOUTH DAILY   TAMSULOSIN  (FLOMAX ) 0.4 MG CAPS CAPSULE    TAKE 1 CAPSULE(0.4 MG) BY MOUTH DAILY   TRIAMCINOLONE  CREAM (KENALOG ) 0.1 %    Apply 1 Application topically 2 (two) times daily.  Modified Medications   No medications on file  Discontinued Medications   No medications on file    Physical Exam:  Vitals:   04/05/24 1137  BP: 122/68  Pulse: 88  Resp: 18  Temp:  (!) 97.5 F (36.4 C)  SpO2: 97%  Weight: 284 lb 9.6 oz (129.1 kg)  Height: 6' (1.829 m)   Body mass index is 38.6 kg/m. Wt Readings from Last 3 Encounters:  04/05/24 284 lb 9.6 oz (129.1 kg)  05/02/23 281 lb 1.6 oz (127.5 kg)  02/21/23 280 lb (127 kg)    Physical Exam Constitutional:      General: He is not in acute distress.    Appearance: He is well-developed. He is not diaphoretic.  HENT:     Head: Normocephalic and atraumatic.     Right Ear: External ear normal.  Left Ear: External ear normal.     Mouth/Throat:     Pharynx: No oropharyngeal exudate.  Eyes:     Conjunctiva/sclera: Conjunctivae normal.     Pupils: Pupils are equal, round, and reactive to light.  Cardiovascular:     Rate and Rhythm: Normal rate and regular rhythm.     Heart sounds: Normal heart sounds.  Pulmonary:     Effort: Pulmonary effort is normal.     Breath sounds: Normal breath sounds.  Abdominal:     General: Bowel sounds are normal.     Palpations: Abdomen is soft.  Musculoskeletal:        General: No tenderness.     Cervical back: Normal range of motion and neck supple.     Right lower leg: No edema.     Left lower leg: No edema.  Skin:    General: Skin is warm and dry.  Neurological:     Mental Status: He is alert and oriented to person, place, and time.    Labs reviewed: Basic Metabolic Panel: No results for input(s): NA, K, CL, CO2, GLUCOSE, BUN, CREATININE, CALCIUM , MG, PHOS, TSH in the last 8760 hours. Liver Function Tests: No results for input(s): AST, ALT, ALKPHOS, BILITOT, PROT, ALBUMIN in the last 8760 hours. No results for input(s): LIPASE, AMYLASE in the last 8760 hours. No results for input(s): AMMONIA in the last 8760 hours. CBC: No results for input(s): WBC, NEUTROABS, HGB, HCT, MCV, PLT in the last 8760 hours. Lipid Panel: No results for input(s): CHOL, HDL, LDLCALC, TRIG, CHOLHDL, LDLDIRECT in the  last 8760 hours. TSH: No results for input(s): TSH in the last 8760 hours. A1C: Lab Results  Component Value Date   HGBA1C 6.4 (H) 02/21/2023     Assessment/Plan Assessment and Plan Assessment & Plan Type 2 diabetes mellitus Encouraged dietary compliance, routine foot care/monitoring and to keep up with diabetic eye exams through ophthalmology  - Ordered blood work for A1c.  Benign prostatic hyperplasia with lower urinary tract symptoms Urinary frequency stable, no recent urologist follow-up since benign biopsy with inflammation. - Followed up PSA levels.  Hiatal hernia with gastroesophageal reflux disease Sensation of food obstruction with fibrous foods; partial relief with omeprazole and Prilosec. - Referred to GI specialist for evaluation and possible endoscopy.  Essential hypertension Blood pressure well controlled on losartan  and amlodipine .  Mixed hyperlipidemia Managed with rosuvastatin . - Ordered lipid panel.  Morbid obesity Weight management challenging; difficulty losing weight in lower body. - Increased Ozempic  to 0.5 mg weekly dose for weight loss.  Elevated prostate specific antigen (PSA) Elevated PSA with benign biopsy findings; no recent urologist follow-up. - Followed up PSA levels. - Encouraged urologist follow-up.  General health maintenance Overdue for tetanus booster; annual wellness visit scheduled. - Advised tetanus booster at pharmacy. - Confirmed annual wellness visit for February 2nd.   Return in about 6 months (around 10/03/2024) for routine follow up.  Darren Fisher K. Caro BOD[facility] [phone]     [1]  Allergies Allergen Reactions   Other Rash    Seafood    "

## 2024-04-06 ENCOUNTER — Ambulatory Visit: Payer: Self-pay | Admitting: Nurse Practitioner

## 2024-04-06 LAB — MICROALBUMIN / CREATININE URINE RATIO
Creatinine, Urine: 170 mg/dL (ref 20–320)
Microalb Creat Ratio: 11 mg/g{creat}
Microalb, Ur: 1.9 mg/dL

## 2024-04-07 LAB — COMPREHENSIVE METABOLIC PANEL WITH GFR
AG Ratio: 1.9 (calc) (ref 1.0–2.5)
ALT: 11 U/L (ref 9–46)
AST: 14 U/L (ref 10–35)
Albumin: 5 g/dL (ref 3.6–5.1)
Alkaline phosphatase (APISO): 98 U/L (ref 35–144)
BUN: 15 mg/dL (ref 7–25)
CO2: 24 mmol/L (ref 20–32)
Calcium: 9.6 mg/dL (ref 8.6–10.3)
Chloride: 104 mmol/L (ref 98–110)
Creat: 1.21 mg/dL (ref 0.70–1.28)
Globulin: 2.6 g/dL (ref 1.9–3.7)
Glucose, Bld: 109 mg/dL — ABNORMAL HIGH (ref 65–99)
Potassium: 4.3 mmol/L (ref 3.5–5.3)
Sodium: 139 mmol/L (ref 135–146)
Total Bilirubin: 0.4 mg/dL (ref 0.2–1.2)
Total Protein: 7.6 g/dL (ref 6.1–8.1)
eGFR: 61 mL/min/1.73m2

## 2024-04-07 LAB — CBC WITH DIFFERENTIAL/PLATELET
Absolute Lymphocytes: 1735 {cells}/uL (ref 850–3900)
Absolute Monocytes: 490 {cells}/uL (ref 200–950)
Basophils Absolute: 30 {cells}/uL (ref 0–200)
Basophils Relative: 0.5 %
Eosinophils Absolute: 159 {cells}/uL (ref 15–500)
Eosinophils Relative: 2.7 %
HCT: 41.9 % (ref 39.4–51.1)
Hemoglobin: 13.1 g/dL — ABNORMAL LOW (ref 13.2–17.1)
MCH: 25.9 pg — ABNORMAL LOW (ref 27.0–33.0)
MCHC: 31.3 g/dL — ABNORMAL LOW (ref 31.6–35.4)
MCV: 83 fL (ref 81.4–101.7)
MPV: 9.5 fL (ref 7.5–12.5)
Monocytes Relative: 8.3 %
Neutro Abs: 3487 {cells}/uL (ref 1500–7800)
Neutrophils Relative %: 59.1 %
Platelets: 294 Thousand/uL (ref 140–400)
RBC: 5.05 Million/uL (ref 4.20–5.80)
RDW: 14.2 % (ref 11.0–15.0)
Total Lymphocyte: 29.4 %
WBC: 5.9 Thousand/uL (ref 3.8–10.8)

## 2024-04-07 LAB — TEST AUTHORIZATION

## 2024-04-07 LAB — IRON,TIBC AND FERRITIN PANEL
%SAT: 21 % (ref 20–48)
Ferritin: 104 ng/mL (ref 24–380)
Iron: 74 ug/dL (ref 50–180)
TIBC: 356 ug/dL (ref 250–425)

## 2024-04-07 LAB — HEMOGLOBIN A1C
Hgb A1c MFr Bld: 6.7 % — ABNORMAL HIGH
Mean Plasma Glucose: 146 mg/dL
eAG (mmol/L): 8.1 mmol/L

## 2024-04-07 LAB — LIPID PANEL
Cholesterol: 118 mg/dL
HDL: 39 mg/dL — ABNORMAL LOW
LDL Cholesterol (Calc): 63 mg/dL
Non-HDL Cholesterol (Calc): 79 mg/dL
Total CHOL/HDL Ratio: 3 (calc)
Triglycerides: 82 mg/dL

## 2024-04-07 LAB — PSA: PSA: 9.44 ng/mL — ABNORMAL HIGH

## 2024-04-25 ENCOUNTER — Other Ambulatory Visit: Payer: Self-pay | Admitting: Nurse Practitioner

## 2024-04-29 ENCOUNTER — Other Ambulatory Visit: Payer: Self-pay | Admitting: Nurse Practitioner

## 2024-05-03 ENCOUNTER — Encounter: Payer: Medicare Other | Admitting: Nurse Practitioner

## 2024-10-04 ENCOUNTER — Ambulatory Visit: Admitting: Nurse Practitioner
